# Patient Record
Sex: Male | Born: 1955 | Race: White | Hispanic: Refuse to answer | Marital: Single | Smoking: Never smoker
Health system: Southern US, Community
[De-identification: ages and names within clinical notes are randomized; demographics above are authoritative.]

## PROBLEM LIST (undated history)

## (undated) DIAGNOSIS — D696 Thrombocytopenia, unspecified: Secondary | ICD-10-CM

## (undated) DIAGNOSIS — C61 Malignant neoplasm of prostate: Secondary | ICD-10-CM

## (undated) DIAGNOSIS — G4730 Sleep apnea, unspecified: Secondary | ICD-10-CM

## (undated) DIAGNOSIS — M1990 Unspecified osteoarthritis, unspecified site: Secondary | ICD-10-CM

## (undated) DIAGNOSIS — N189 Chronic kidney disease, unspecified: Secondary | ICD-10-CM

## (undated) DIAGNOSIS — N200 Calculus of kidney: Secondary | ICD-10-CM

## (undated) DIAGNOSIS — Z87442 Personal history of urinary calculi: Secondary | ICD-10-CM

## (undated) DIAGNOSIS — E538 Deficiency of other specified B group vitamins: Secondary | ICD-10-CM

## (undated) DIAGNOSIS — R609 Edema, unspecified: Secondary | ICD-10-CM

## (undated) DIAGNOSIS — I10 Essential (primary) hypertension: Secondary | ICD-10-CM

## (undated) DIAGNOSIS — M109 Gout, unspecified: Secondary | ICD-10-CM

## (undated) HISTORY — PX: TONSILLECTOMY: SUR1361

## (undated) HISTORY — DX: Essential (primary) hypertension: I10

## (undated) HISTORY — PX: PROSTATE SURGERY: SHX751

## (undated) HISTORY — PX: COLONOSCOPY: SHX174

---

## 2000-03-03 ENCOUNTER — Encounter: Payer: Self-pay | Admitting: Internal Medicine

## 2000-12-19 ENCOUNTER — Encounter: Payer: Self-pay | Admitting: Internal Medicine

## 2003-05-11 ENCOUNTER — Encounter: Payer: Self-pay | Admitting: Internal Medicine

## 2003-11-10 ENCOUNTER — Ambulatory Visit: Payer: Self-pay | Admitting: Unknown Physician Specialty

## 2004-01-31 ENCOUNTER — Observation Stay: Payer: Self-pay

## 2006-01-24 HISTORY — PX: PROSTATECTOMY: SHX69

## 2006-01-26 ENCOUNTER — Encounter (INDEPENDENT_AMBULATORY_CARE_PROVIDER_SITE_OTHER): Payer: Self-pay | Admitting: Specialist

## 2006-01-26 ENCOUNTER — Inpatient Hospital Stay (HOSPITAL_COMMUNITY): Admission: RE | Admit: 2006-01-26 | Discharge: 2006-01-28 | Payer: Self-pay | Admitting: Urology

## 2006-11-02 DIAGNOSIS — I10 Essential (primary) hypertension: Secondary | ICD-10-CM | POA: Insufficient documentation

## 2006-11-22 ENCOUNTER — Ambulatory Visit: Payer: Self-pay | Admitting: Internal Medicine

## 2006-11-22 DIAGNOSIS — C61 Malignant neoplasm of prostate: Secondary | ICD-10-CM

## 2006-11-22 DIAGNOSIS — G479 Sleep disorder, unspecified: Secondary | ICD-10-CM | POA: Insufficient documentation

## 2006-11-22 DIAGNOSIS — M109 Gout, unspecified: Secondary | ICD-10-CM

## 2006-11-27 ENCOUNTER — Encounter: Payer: Self-pay | Admitting: Internal Medicine

## 2007-05-17 ENCOUNTER — Ambulatory Visit: Payer: Self-pay | Admitting: Internal Medicine

## 2007-05-21 LAB — CONVERTED CEMR LAB
Albumin: 4.1 g/dL (ref 3.5–5.2)
Basophils Absolute: 0 10*3/uL (ref 0.0–0.1)
Basophils Relative: 0.1 % (ref 0.0–1.0)
CO2: 32 meq/L (ref 19–32)
Cholesterol: 194 mg/dL (ref 0–200)
Creatinine, Ser: 1.1 mg/dL (ref 0.4–1.5)
Eosinophils Absolute: 0.2 10*3/uL (ref 0.0–0.7)
GFR calc Af Amer: 91 mL/min
GFR calc non Af Amer: 75 mL/min
LDL Cholesterol: 129 mg/dL — ABNORMAL HIGH (ref 0–99)
Lymphocytes Relative: 25.4 % (ref 12.0–46.0)
MCHC: 33.7 g/dL (ref 30.0–36.0)
Neutrophils Relative %: 63.7 % (ref 43.0–77.0)
Phosphorus: 3.4 mg/dL (ref 2.3–4.6)
Platelets: 202 10*3/uL (ref 150–400)
RBC: 5.69 M/uL (ref 4.22–5.81)
Sodium: 142 meq/L (ref 135–145)
VLDL: 36 mg/dL (ref 0–40)

## 2008-01-21 ENCOUNTER — Ambulatory Visit: Payer: Self-pay | Admitting: Internal Medicine

## 2008-03-06 ENCOUNTER — Encounter (INDEPENDENT_AMBULATORY_CARE_PROVIDER_SITE_OTHER): Payer: Self-pay | Admitting: *Deleted

## 2008-09-08 ENCOUNTER — Encounter: Payer: Self-pay | Admitting: Internal Medicine

## 2008-11-24 ENCOUNTER — Ambulatory Visit: Payer: Self-pay | Admitting: Internal Medicine

## 2008-11-26 LAB — CONVERTED CEMR LAB
ALT: 30 units/L (ref 0–53)
AST: 26 units/L (ref 0–37)
Alkaline Phosphatase: 78 units/L (ref 39–117)
Bilirubin, Direct: 0 mg/dL (ref 0.0–0.3)
Calcium: 8.7 mg/dL (ref 8.4–10.5)
Creatinine, Ser: 1.2 mg/dL (ref 0.4–1.5)
Eosinophils Absolute: 0.3 10*3/uL (ref 0.0–0.7)
Eosinophils Relative: 2.9 % (ref 0.0–5.0)
Glucose, Bld: 90 mg/dL (ref 70–99)
HDL: 35.7 mg/dL — ABNORMAL LOW (ref >39.00)
LDL Cholesterol: 129 mg/dL — ABNORMAL HIGH (ref 0–99)
Lymphocytes Relative: 21.3 % (ref 12.0–46.0)
MCV: 90.3 fL (ref 78.0–100.0)
Neutrophils Relative %: 66 % (ref 43.0–77.0)
Phosphorus: 3.1 mg/dL (ref 2.3–4.6)
Platelets: 188 10*3/uL (ref 150.0–400.0)
Potassium: 4.2 meq/L (ref 3.5–5.1)
Sodium: 141 meq/L (ref 135–145)
Total CHOL/HDL Ratio: 5
Total Protein: 7.2 g/dL (ref 6.0–8.3)
VLDL: 24.8 mg/dL (ref 0.0–40.0)
WBC: 9.8 10*3/uL (ref 4.5–10.5)

## 2009-03-09 ENCOUNTER — Ambulatory Visit: Payer: Self-pay | Admitting: Internal Medicine

## 2009-03-10 LAB — CONVERTED CEMR LAB
Albumin: 4 g/dL (ref 3.5–5.2)
BUN: 14 mg/dL (ref 6–23)
CO2: 31 meq/L (ref 19–32)
Calcium: 9.1 mg/dL (ref 8.4–10.5)
Chloride: 103 meq/L (ref 96–112)
Creatinine, Ser: 1 mg/dL (ref 0.4–1.5)

## 2009-03-16 ENCOUNTER — Encounter: Payer: Self-pay | Admitting: Internal Medicine

## 2009-10-30 ENCOUNTER — Ambulatory Visit: Payer: Self-pay | Admitting: Internal Medicine

## 2010-02-23 NOTE — Assessment & Plan Note (Signed)
Summary: 2 MTH FU/CLE   Vital Signs:  Patient profile:   55 year old male Weight:      296 pounds Temp:     98.5 degrees F oral Pulse rate:   72 / minute Pulse rhythm:   regular BP sitting:   140 / 80  (left arm) Cuff size:   large  Vitals Entered By: Mervin Hack CMA Duncan Dull) (March 09, 2009 10:33 AM)  Serial Vital Signs/Assessments:  Time      Position  BP       Pulse  Resp  Temp     By           R Arm     144/98                         Cindee Salt MD  CC: follow-up visit   History of Present Illness: No problems with the losartan except a vague sense of being "rundown" some tired feelings intermittently Nodding off at times (like in movie tomorrow) Did give up his AM coffee  weight up 9# He has been trying to be more careful with diet Not going on treadmill very often No drinks with calories for the most part  No ankle edema for the most part No chest pain  No SOB  Allergies: 1)  Ziac (Bisoprolol-Hydrochlorothiazide) 2)  Zestoretic (Lisinopril-Hydrochlorothiazide) 3)  Avapro (Irbesartan) 4)  Amlodipine Besylate (Amlodipine Besylate)  Past History:  Past medical, surgical, family and social histories (including risk factors) reviewed for relevance to current acute and chronic problems.  Past Medical History: Reviewed history from 11/24/2008 and no changes required. Hypertension Gout Prostate cancer-------------------------Dr Reuel Boom  Past Surgical History: Reviewed history from 11/22/2006 and no changes required. Syncope- Echo/ Cardiolite negative 02/02 Radical prostatectomy 1/08  Family History: Reviewed history from 11/22/2006 and no changes required. Dad with colon cancer, HTN Mom died @58 --breast cancer 1 brother No CAD, DM No other prostate cancer  Social History: Reviewed history from 01/21/2008 and no changes required.  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   Neck:  supple, no masses, no thyromegaly, no carotid bruits, and no cervical lymphadenopathy.   Lungs:  normal respiratory effort and normal breath sounds.   Heart:  normal rate, regular rhythm, no murmur, and no gallop.   Extremities:  no sig edema Psych:  normally interactive, good eye contact, not anxious appearing, and not depressed appearing.     Impression & Recommendations:  Problem # 1:  HYPERTENSION (ICD-401.9) Assessment Improved  improved but still not optimal discussed lifestyle given his weight gain no sig issues with the med  will check renal profile  His updated medication list for this problem includes:    Atenolol 100 Mg Tabs (Atenolol) .Marland Kitchen... Take 1 tablet by mouth once a day    Losartan Potassium 50 Mg Tabs (Losartan potassium) .Marland Kitchen... 1 by mouth daily for high blood pressure  BP today: 140/80 Prior BP: 140/100 (11/24/2008)  Labs Reviewed: K+: 4.2 (11/24/2008) Creat: : 1.2 (11/24/2008)   Chol: 189 (11/24/2008)   HDL: 35.70 (11/24/2008)   LDL: 129 (11/24/2008)   TG: 124.0 (11/24/2008)  Orders: TLB-Renal Function Panel (80069-RENAL) Venipuncture (16109)  Complete Medication List: 1)  Atenolol 100 Mg Tabs (Atenolol) .... Take 1 tablet by mouth once a day 2)  Coenzyme Q10 100 Mg Caps (Coenzyme q10) .... Take 2 by mouth once daily 3)  Fish Oil 500 Mg Caps (Omega-3 fatty acids) .... Take 2 by mouth once daily 4)  Vitamin D3 2000 Unit Caps (Cholecalciferol) .... Take 2 by mouth once daily 5)  Vitamin C 500 Mg Tabs (Ascorbic acid) .... Take 2 by mouth once daily 6)  Losartan Potassium 50 Mg Tabs (Losartan potassium) .Marland Kitchen.. 1 by mouth daily for high blood pressure  Patient Instructions: 1)  Please schedule a follow-up appointment in 6 months .   Current Allergies (reviewed  today): ZIAC (BISOPROLOL-HYDROCHLOROTHIAZIDE) ZESTORETIC (LISINOPRIL-HYDROCHLOROTHIAZIDE) AVAPRO (IRBESARTAN) AMLODIPINE BESYLATE (AMLODIPINE BESYLATE)

## 2010-02-23 NOTE — Assessment & Plan Note (Signed)
Summary: FOLLOW UP / LFW   Vital Signs:  Patient profile:   55 year old male Weight:      284 pounds BMI:     40.90 O2 Sat:      94 % on Room air Temp:     98.8 degrees F oral Pulse rate:   52 / minute Pulse rhythm:   regular BP sitting:   140 / 80  (left arm) Cuff size:   large  Vitals Entered By: Mervin Hack CMA Duncan Dull) (October 30, 2009 10:57 AM)  O2 Flow:  Room air CC: 6 month follow-up   History of Present Illness: Doing okay has lost  some weight tryking to cut back on portions and have more fruit walking regularly and lifting some weights  BP has been fine Wishes he didn't need them Trouble remembering if he takes them some times--discussed a pill holder  No chest pain No SOB SLight ankle edema at times  Allergies: 1)  Ziac (Bisoprolol-Hydrochlorothiazide) 2)  Zestoretic (Lisinopril-Hydrochlorothiazide) 3)  Avapro (Irbesartan) 4)  Amlodipine Besylate (Amlodipine Besylate)  Past History:  Past medical, surgical, family and social histories (including risk factors) reviewed for relevance to current acute and chronic problems.  Past Medical History: Reviewed history from 11/24/2008 and no changes required. Hypertension Gout Prostate cancer-------------------------Dr Reuel Boom  Past Surgical History: Reviewed history from 11/22/2006 and no changes required. Syncope- Echo/ Cardiolite negative 02/02 Radical prostatectomy 1/08  Family History: Reviewed history from 11/22/2006 and no changes required. Dad with colon cancer, HTN Mom died @58 --breast cancer 1 brother No CAD, DM No other prostate cancer  Social History: Reviewed history from 01/21/2008 and no changes required.  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   Neck:   supple, no masses, no thyromegaly, no carotid bruits, and no cervical lymphadenopathy.   Lungs:  normal respiratory effort, no intercostal retractions, no accessory muscle use, and normal breath sounds.   Heart:  normal rate, regular rhythm, no murmur, and no gallop.   Extremities:  no edema Psych:  normally interactive, good eye contact, not anxious appearing, and not depressed appearing.     Impression & Recommendations:  Problem # 1:  HYPERTENSION (ICD-401.9) Assessment Unchanged doing well no changes needed hopes to get off meds as he works on fitness  His updated medication list for this problem includes:    Losartan Potassium 50 Mg Tabs (Losartan potassium) .Marland Kitchen... 1 by mouth daily for high blood pressure    Atenolol 100 Mg Tabs (Atenolol) .Marland Kitchen... Take 1 tablet by mouth once a day  BP today: 140/80 Prior BP: 140/80 (03/09/2009)  Labs Reviewed: K+: 4.3 (03/09/2009) Creat: : 1.0 (03/09/2009)   Chol: 189 (11/24/2008)   HDL: 35.70 (11/24/2008)   LDL: 129 (11/24/2008)   TG: 124.0 (11/24/2008)  Complete Medication List: 1)  Losartan Potassium 50 Mg Tabs (Losartan potassium) .Marland Kitchen.. 1 by mouth daily for high blood pressure 2)  Atenolol 100 Mg Tabs (Atenolol) .... Take 1 tablet by mouth once a day 3)  Coenzyme Q10 100 Mg Caps (Coenzyme q10) .... Take 2 by mouth once daily 4)  Fish Oil 500 Mg Caps (Omega-3 fatty acids) .... Take 2 by mouth once daily 5)  Vitamin D3 2000 Unit Caps (Cholecalciferol) .... Take 2 by mouth once daily  6)  Vitamin C 500 Mg Tabs (Ascorbic acid) .... Take 2 by mouth once daily 7)  L-arginine 1000 Mg Tabs (Arginine) .... Take 1 by mouth once daily  Patient Instructions: 1)  Please schedule a follow-up appointment in 6 months for physical Prescriptions: ATENOLOL 100 MG  TABS (ATENOLOL) Take 1 tablet by mouth once a day  #90 Tablet x 3   Entered by:   Mervin Hack CMA (AAMA)   Authorized by:   Cindee Salt MD   Signed by:   Mervin Hack CMA (AAMA) on  10/30/2009   Method used:   Electronically to        [facility] [address]* (retail)       [address] [place], [place]  [address]       Ph: [phone]       Fax: [phone]   RxID:   [id number] LOSARTAN POTASSIUM 50 MG TABS (LOSARTAN POTASSIUM) 1 by mouth daily for high blood pressure  #30 x 12   Entered by:   Mervin Hack CMA (AAMA)   Authorized by:   Cindee Salt MD   Signed by:   Mervin Hack CMA (AAMA) on 10/30/2009   Method used:   Electronically to        [facility] [address] (retail)       [address] [place], [place]  [address]       Ph: [phone]       Fax: [phone]   RxID:   [id number]   Current Allergies (reviewed today): ZIAC (BISOPROLOL-HYDROCHLOROTHIAZIDE) ZESTORETIC (LISINOPRIL-HYDROCHLOROTHIAZIDE) AVAPRO (IRBESARTAN) AMLODIPINE BESYLATE (AMLODIPINE BESYLATE)  Appended Document: FOLLOW UP / LFW    Clinical Lists Changes  Orders: Added new Service order of Admin 1st Vaccine (70623) - Signed Added new Service order of Flu Vaccine 43yrs + [phone]) - Signed Observations: Added new observation of FLU VAX VIS: 08/18/09 version (10/30/2009 11:45) Added new observation of FLU VAXLOT: [id number] (10/30/2009 11:45) Added new observation of FLU VAXMFR: [employer]axosmith[employer]line (10/30/2009 11:45) Added new observation of FLU VAX EXP: 07/24/2010 (10/30/2009 11:45) Added new observation of FLU VAX DSE: 0.73ml (10/30/2009 11:45) Added new observation of FLU VAX: Fluvax 3+ (10/30/2009 11:45)    Flu Vaccine Consent Questions     Do you have a history of severe allergic reactions to this vaccine? no    Any prior history of allergic reactions to egg and/or gelatin? no    Do you have a sensitivity to the preservative Thimersol? no    Do you have a past history of Guillan-Barre Syndrome? no    Do you currently have an acute febrile illness? no    Have you ever had a  severe reaction to latex? no    Vaccine information given and explained to patient? yes    Are you currently pregnant? no    Lot Number:[id number]   Exp Date:07/24/2010   Site Given  Left Deltoid IM    .lbflu

## 2010-02-23 NOTE — Letter (Signed)
Summary: [facility]  [facility]   Imported By: Lanelle Bal 03/18/2009 12:45:41  _____________________________________________________________________  External Attachment:    Type:   Image     Comment:   External Document  Appended Document: [facility] prostate cancer follow up checking PSA but requests that I do it yearly and send him back it if goes up any

## 2010-03-10 ENCOUNTER — Encounter: Payer: Self-pay | Admitting: Internal Medicine

## 2010-05-03 ENCOUNTER — Encounter: Payer: Self-pay | Admitting: Internal Medicine

## 2010-06-11 NOTE — Op Note (Signed)
NAME:  Francisco Freeman, Francisco Freeman NO.:  [id number]   MEDICAL RECORD NO.:  [id number]          PATIENT TYPE:  INP   LOCATION:  0002                         FACILITY:  [facility]   PHYSICIAN:  Heloise Purpura, MD      DATE OF BIRTH:  27-Apr-1955   DATE OF PROCEDURE:  01/26/2006  DATE OF DISCHARGE:                               OPERATIVE REPORT   PREOPERATIVE DIAGNOSIS:  Clinically localized adenocarcinoma of  prostate.   POSTOPERATIVE DIAGNOSIS:  Clinically localized adenocarcinoma of  prostate.   PROCEDURES.:  1. Robotic assisted laparoscopic radical prostatectomy (left nerve      sparing).  2. Bilateral laparoscopic pelvic lymphadenectomy.   SURGEON:  Heloise Purpura, MD   ASSISTANT:  Excell Seltzer. Annabell Howells, M.D.   ANESTHESIA:  General.   COMPLICATIONS:  None.   ESTIMATED BLOOD LOSS:  500 mL.   INTRAVENOUS FLUIDS:  1700 mL of lactated Ringer's.   SPECIMENS:  1. Prostate and seminal vesicles.  2. Right pelvic lymph nodes.  3. Left pelvic lymph nodes.   DRAINS:  1. #20-French Coude catheter.  2. #19 Blake pelvic drain.   INDICATIONS:  Mr. Schoneman is a 55 year old gentleman with clinical  stage T1C prostate cancer with a PSA of 5.8 and Gleason score 4 + 3 = 7.  Pretreatment IIEF score was 19 with an IPSS score of 1.  He underwent a  bone scan and CT scan which were negative for metastatic disease.  After  discussing management options for clinically localized prostate cancer,  the patient elected to proceed with the above procedures.  Potential  risks and benefits were discussed with the patient and he consented.   DESCRIPTION OF PROCEDURE:  The patient was taken to the operating room  and a general anesthetic was administered.  He was given preoperative  antibiotics, placed in the dorsal lithotomy position, prepped and draped  in the usual sterile fashion.  Next a preoperative time-out was  performed.  A Foley catheter was then inserted into the bladder.  A site  was  selected 18 cm from the pubic symphysis and just to the left of the  umbilicus for placement of the camera port.  This was placed using a  standard open Hassan technique.  This allowed entry into the peritoneal  cavity under direct vision and without difficulty.  Due to the patient's  obese abdomen, a long 12 mm port was placed.  A pneumoperitoneum was  established and a 0 degrees lens was used to inspect the abdomen.  There  was no evidence of any intra-abdominal injuries or other abnormalities  except an excessive amount of intra-abdominal fat.  Attention was then  turned to placement of the remaining ports.  Bilateral 8 mm robotic  ports were placed 16 cm from the pubic symphysis and 10 cm lateral to  the camera port.  An additional 8 mm port was placed in the far left  lateral abdominal wall.  A 5 mm port was placed between the camera port  and the right robotic port.  An additional 12 mm port was placed in the  far right lateral abdominal  wall for laparoscopic assistance.  All ports  placed under direct vision without difficulty.  Surgical cart was then  docked.  With the aid of the cautery scissors, the bladder was reflected  posteriorly allowing entry into the space of Retzius and identification  of the endopelvic fascia and prostate.  The endopelvic fascia was then  incised from the apex back to the base of prostate bilaterally and the  underlying levator muscle fibers were swept laterally off the prostate.  This isolated the dorsal venous complex which was then stapled and  divided with a 45 mm flex ETS stapler.  Attention then turned to the  bladder neck which was identified with the aid of Foley catheter  manipulation.  The anterior bladder neck was incised exposing the Foley  catheter.  The catheter balloon was deflated and the catheter was  brought into the operative field and used to retract the prostate  anteriorly.  The posterior bladder neck was then divided and  dissection  continued between the bladder neck and prostate until seminal vesicles  and vasa differentia were identified.  The vasa differentia were  isolated and divided.  Seminal vesicles were also dissected free with  care to control the seminal vesicle arterial blood supply.  The seminal  vesicles were then also lifted anteriorly.  The space between  Denonvilliers fascia and the anterior rectum was then bluntly developed  thereby isolating the vascular pedicles of the prostate.  The lateral  prostatic fascia on the left side of the prostate was incised allowing  the neurovascular bundle to be swept laterally and posteriorly off the  prostate.  The prostate pedicle was then ligated with Hem-o-lok clips  and sharply divided with cold scissor dissection above this level.  The  vascular pedicle on the right side was then ligated in a wide non nerve  sparing fashion with Hem-o-lok clips and divided.  The neurovascular  bundle was swept off the apex of the prostate on the left side.  The  urethra was then sharply divided allowing the prostate specimen be  disarticulated and placed up into the abdomen for later removal.  The  pelvis was then copiously irrigated.  With irrigation in the pelvis, air  was injected into the rectal catheter and there was no evidence of a  rectal injury.  Hemostasis was ensured.  Attention then turned to the  right pelvic sidewall.  The fibrofatty tissue between the external iliac  vein, confluence of iliac vessels, obturator nerve, and Cooper's  ligament was dissected free from the pelvic sidewall with Hem-o-lok  clips used for hemostasis and lymphostasis.  This specimen was then  passed off for permanent pathologic analysis.  An identical procedure  was then performed on the contralateral side.  Attention then returned  to the pelvis.  A 2-0 Vicryl stitch was placed at the 6 o'clock position using a slip knot to reapproximate the bladder neck and urethra.  A   double-armed 3-0 Monocryl suture was then used to perform a 360 degrees  running tension-free anastomosis between the bladder neck and urethra.  A new 20-French Coude catheter was inserted into the bladder.  This  catheter was irrigated.  There were no evidence of blood clots in the  bladder and the anastomosis appeared to be watertight.  A #19 Blake  pelvic drain was then brought to the left robotic port and appropriately  positioned in the pelvis.  It was secured to skin with a nylon suture.  The Endopouch retrieval  bag was then used to retrieve the prostate  specimen through the periumbilical port.  A 0 Vicryl suture was used to  close the right lateral 12 mm port site with the aid of the suture  passer device.  The prostate specimen was then removed intact within the  Endopouch retrieval bag.  This fascial opening was closed with  interrupted figure-of-eight 0 Vicryl sutures.  All remaining ports had  been removed under direct vision.  All port sites were then injected  with 0.25% Marcaine and reapproximated at the skin level with staples.  The patient appeared to tolerate the procedure well and without  complications.  He was able to be awakened and transferred to recovery  in satisfactory condition.          ______________________________  Heloise Purpura, MD  Electronically Signed    LB/MEDQ  D:  01/26/2006  T:  01/26/2006  Job:  [id number]

## 2010-06-11 NOTE — Discharge Summary (Signed)
NAME:  Francisco Freeman, VATH NO.:  [id number]   MEDICAL RECORD NO.:  [id number]          PATIENT TYPE:  INP   LOCATION:  1403                         FACILITY:  [facility]   PHYSICIAN:  Heloise Purpura, MD      DATE OF BIRTH:  Feb 12, 1955   DATE OF ADMISSION:  01/26/2006  DATE OF DISCHARGE:  01/28/2006                               DISCHARGE SUMMARY   ADMISSION DIAGNOSIS:  Prostate cancer.   DISCHARGE DIAGNOSIS:  Prostate cancer.   PROCEDURE:  1. Robotic assisted laparoscopic radical prostatectomy.  2. Bilateral pelvic lymphadenectomy.   HISTORY:  For full details please see the admission history and  physical. Briefly, [name] is a 55 year old gentleman with clinical  stage T1C prostate cancer with a PSA of 5.8 and Gleason score 4+3=7.  He  underwent a CT scan and bone scan which were negative for metastatic  disease. After discussing management options for clinically localized  prostate cancer, the patient elected to proceed with surgical therapy  with a robotic prostatectomy.   HOSPITAL COURSE:  On January 26, 2006 the patient was taken to the  operating room and underwent a robotic assisted laparoscopic radical  prostatectomy and bilateral pelvic lymphadenectomy. He tolerated this  procedure well without complication. Postoperatively, he was able to be  transferred to a regular room after recovering from anesthesia. On the  evening of surgery, he was able to begin ambulating which he did without  difficulty. On postoperative day one, he was gradually advanced to a  clear liquid diet. He was then transitioned to oral pain medication. On  the afternoon of postoperative day one, he was ambulating without  tremendous difficulty. However, he still was having fairly significant  nausea. Due to the fact that he was having persistent nausea and the  fact that he lives alone, it was decided to observe him over night.  On  postoperative day two, he was doing well and was  able to be discharged  home. His renal function and hemoglobin levels were monitored throughout  his hospital course and were found to be stable. In addition, he had  minimal output from his pelvic drain which was able to be removed on  postoperative day one.   DISPOSITION:  Home.   DISCHARGE MEDICATIONS:  1. Patient was instructed to resume his regular home medications      except any aspirin or nonsteroidal antiinflammatory drugs or herbal      supplements for  1 week.  1. He was given a prescription to take Vicodin p.r.n. for pain.  2. He was told to use Colace as a stool softener.  3. He was also given a prescription to begin Cipro one day prior to      his return visit for Foley catheter removal.   DISCHARGE INSTRUCTIONS:  The patient was instructed to be ambulatory but  specifically told to refrain from any heavy lifting, strenuous activity,  or driving.  He was instructed on routine Foley catheter care and given a leg bag for  daytime usage.   FOLLOW UP:  Mr. Renteria will follow-up in one week for removal  of his  Foley catheter as well as to review his surgical pathology in detail.           ______________________________  Heloise Purpura, MD  Electronically Signed     LB/MEDQ  D:  01/28/2006  T:  01/28/2006  Job:  [id number]

## 2010-06-11 NOTE — H&P (Signed)
NAME:  Francisco Freeman, ALBEE NO.:  [id number]   MEDICAL RECORD NO.:  [id number]          PATIENT TYPE:  INP   LOCATION:  NA                           [facility]ACILITY[facility]  [facility]   PHYSICIAN:  Heloise Purpura, MD      DATE OF BIRTH:  04/28/1955   DATE OF ADMISSION:  DATE OF DISCHARGE:                              HISTORY & PHYSICAL   CHIEF COMPLAINT:  Prostate cancer.   HISTORY:  [name] is a 55 year old gentleman with clinical stage  T1c prostate cancer with a PSA of 5.8 and Gleason score 4 +3 = 7.  Two  out of 8 cores were positive on his prostate biopsy on the right side of  the prostate.  He underwent a bone scan and CT scan which were negative  for metastatic disease.  After discussing management options for  clinically localized prostate cancer, the patient elected to proceed  with surgical therapy with a robotic prostatectomy.   PAST MEDICAL HISTORY:  Hypertension.   PAST SURGICAL HISTORY:  None.   MEDICATIONS:  1. Triamterene.  2. Atenolol.   ALLERGIES:  No known drug allergies.   FAMILY HISTORY:  There is a history of colon cancer in the patient's  father.  His mother died of breast cancer.  There is no history of GU  malignancy or prostate cancer, specifically.   SOCIAL HISTORY:  The patient works as a Manufacturing engineer.  He works mostly  on the computer from home.  He is single.  He denies tobacco use.  He  drinks approximately 3 glasses of alcohol per day.   REVIEW OF SYSTEMS:  A complete review of systems was performed, and all  systems are negative.   PHYSICAL EXAMINATION:  CONSTITUTIONAL:  Well-nourished, well-developed,  age-appropriate male in no acute distress.  CARDIOVASCULAR:  Regular rate and rhythm without obvious murmurs.  LUNGS:  Clear bilaterally.  ABDOMEN:  Obese, soft, nontender, nondistended.  DRE:  Prostate is about 30 g without nodularity or induration.   IMPRESSION:  Clinically localized adenocarcinoma of the prostate.   PLAN:  Mr.  Francisco Freeman will undergo a robotic-assisted laparoscopic radical  prostatectomy and bilateral pelvic lymphadenectomy.  He will then be  admitted to the hospital for routine postoperative care.           ______________________________  Heloise Purpura, MD  Electronically Signed     LB/MEDQ  D:  01/26/2006  T:  01/26/2006  Job:  [id number]

## 2010-06-14 ENCOUNTER — Encounter: Payer: Self-pay | Admitting: Internal Medicine

## 2010-06-14 ENCOUNTER — Ambulatory Visit (INDEPENDENT_AMBULATORY_CARE_PROVIDER_SITE_OTHER): Payer: BC Managed Care – PPO | Admitting: Internal Medicine

## 2010-06-14 VITALS — BP 140/100 | HR 56 | Temp 98.1°F | Ht 70.0 in | Wt 296.5 lb

## 2010-06-14 DIAGNOSIS — Z0000 Encounter for general adult medical examination without abnormal findings: Secondary | ICD-10-CM | POA: Insufficient documentation

## 2010-06-14 DIAGNOSIS — G479 Sleep disorder, unspecified: Secondary | ICD-10-CM

## 2010-06-14 DIAGNOSIS — C61 Malignant neoplasm of prostate: Secondary | ICD-10-CM

## 2010-06-14 DIAGNOSIS — M109 Gout, unspecified: Secondary | ICD-10-CM

## 2010-06-14 DIAGNOSIS — I10 Essential (primary) hypertension: Secondary | ICD-10-CM

## 2010-06-14 LAB — HEPATIC FUNCTION PANEL
ALT: 29 U/L (ref 0–53)
Bilirubin, Direct: 0.2 mg/dL (ref 0.0–0.3)
Total Bilirubin: 0.7 mg/dL (ref 0.3–1.2)

## 2010-06-14 LAB — BASIC METABOLIC PANEL
BUN: 11 mg/dL (ref 6–23)
CO2: 27 mEq/L (ref 19–32)
Chloride: 103 mEq/L (ref 96–112)
Creatinine, Ser: 1.1 mg/dL (ref 0.4–1.5)
Glucose, Bld: 71 mg/dL (ref 70–99)
Potassium: 4.2 mEq/L (ref 3.5–5.1)

## 2010-06-14 LAB — CBC WITH DIFFERENTIAL/PLATELET
Basophils Absolute: 0.1 10*3/uL (ref 0.0–0.1)
Basophils Relative: 0.8 % (ref 0.0–3.0)
Eosinophils Relative: 3.5 % (ref 0.0–5.0)
Lymphocytes Relative: 24.3 % (ref 12.0–46.0)
MCV: 87.9 fl (ref 78.0–100.0)
Monocytes Absolute: 0.7 10*3/uL (ref 0.1–1.0)
Monocytes Relative: 7.9 % (ref 3.0–12.0)
Neutrophils Relative %: 63.5 % (ref 43.0–77.0)
RBC: 5.67 Mil/uL (ref 4.22–5.81)
WBC: 8.4 10*3/uL (ref 4.5–10.5)

## 2010-06-14 LAB — URIC ACID: Uric Acid, Serum: 6.5 mg/dL (ref 4.0–7.8)

## 2010-06-14 MED ORDER — ATENOLOL 100 MG PO TABS: 100.0000 mg | ORAL_TABLET | Freq: Every day | ORAL | Status: DC

## 2010-06-14 NOTE — Progress Notes (Signed)
Subjective:    Patient ID: Francisco Freeman, male    DOB: 1956-01-06, 55 y.o.   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  , Disp: , Rfl: ;  atenolol (TENORMIN) 100 MG tablet, Take 100 mg by mouth daily.  , Disp: , Rfl: ;  Cholecalciferol (VITAMIN D3) 2000 UNITS TABS, Take 1 tablet by mouth 2 (two) times daily.  , Disp: , Rfl: ;  losartan (COZAAR) 50 MG tablet, Take 50 mg by mouth daily.  , Disp: , Rfl:  DISCONTD: co-enzyme Q-10 50 MG capsule, Take 50 mg by mouth 2 (two) times daily.  , Disp: , Rfl: ;  DISCONTD: L-Arginine 1000 MG TABS, Take 1 tablet by mouth daily.  , Disp: , Rfl: ;  DISCONTD: Omega-3 Fatty Acids (FISH OIL) 500 MG CAPS, Take 2 capsules by mouth daily.  , Disp: , Rfl:   Past Medical History  Diagnosis Date  . Hypertension   . Gout   . Cancer     prostate    Past Surgical History  Procedure Date  . Prostatectomy 01/24/2006    Family History  Problem Relation Age of Onset  . Cancer Mother 29    breast  . Cancer Father     colon  . Hypertension Father     History   Social History  . Marital Status: Single    Spouse Name: N/A    Number of Children: 0  . Years of Education: N/A   Occupational History  . Drafter     [employer]   Social History Main Topics  . Smoking status: Never Smoker   . Smokeless tobacco: Never Used  . Alcohol Use: 0.0 oz/week    4-6 Cans of beer per week  . Drug Use: No  . Sexually Active: Not on file   Other Topics Concern  . Not on file   Social History Narrative  . No narrative on file   Review of Systems  Constitutional: Negative  for fever and fatigue.       Wears seat belt  HENT: Positive for dental problem. Negative for hearing loss, congestion, rhinorrhea and tinnitus.        Needs cavities filled---delayed due to money  Eyes: Negative for visual disturbance.       No diplopia or focal vision loss  Respiratory: Negative for cough, chest tightness and shortness of breath.   Cardiovascular: Positive for leg swelling. Negative for chest pain and palpitations.       Occ leg edema---at the end of day. Better in AM  Gastrointestinal: Negative for nausea, vomiting, constipation and blood in stool.       No heartburn  Genitourinary: Negative for dysuria, urgency, decreased urine volume and difficulty urinating.       Mild stress incontinence since surgery---uses pad when out Some ED since the surgery  Musculoskeletal: Negative for joint swelling, arthralgias and gait problem.  Skin: Negative for rash.       Has hard nodule on lateral right thigh---stable  Neurological: Negative for dizziness, syncope, weakness, light-headedness, numbness and headaches.  Hematological: Negative for adenopathy. Does not bruise/bleed easily.  Psychiatric/Behavioral: Negative for sleep disturbance and dysphoric mood. The patient is not nervous/anxious.        Work stress but no sig mood issues Very limited beer now       Objective:   Physical Exam  Constitutional: He appears well-developed and well-nourished. No distress.  HENT:  Head: Normocephalic and atraumatic.  Right Ear: External ear normal.  Left Ear: External ear normal.  Mouth/Throat: Oropharynx is clear and moist. No oropharyngeal exudate.       TMs normal  Eyes: Conjunctivae and EOM are normal. Pupils are equal, round, and reactive to light.       Fundi benign  Neck: Normal range of motion. Neck supple. No thyromegaly present.  Cardiovascular: Normal rate, regular rhythm, normal heart sounds and intact distal pulses.  Exam reveals no gallop.   No murmur  heard. Pulmonary/Chest: Effort normal and breath sounds normal. No respiratory distress. He has no wheezes. He has no rales.  Abdominal: Soft. There is no tenderness.  Musculoskeletal: Normal range of motion. He exhibits no tenderness.       Trace edema  Lymphadenopathy:    He has no cervical adenopathy.  Skin: Skin is warm. No rash noted.       4-34mm nodule on right thigh---probably dermatofibroma  Psychiatric: He has a normal mood and affect. His behavior is normal. Judgment and thought content normal.          Assessment & Plan:

## 2010-11-02 ENCOUNTER — Other Ambulatory Visit: Payer: Self-pay | Admitting: *Deleted

## 2010-11-02 MED ORDER — LOSARTAN POTASSIUM 50 MG PO TABS: 50.0000 mg | ORAL_TABLET | Freq: Every day | ORAL | Status: DC

## 2010-12-02 ENCOUNTER — Other Ambulatory Visit: Payer: Self-pay | Admitting: *Deleted

## 2010-12-02 MED ORDER — LOSARTAN POTASSIUM 50 MG PO TABS: 50.0000 mg | ORAL_TABLET | Freq: Every day | ORAL | Status: DC

## 2010-12-14 ENCOUNTER — Ambulatory Visit: Payer: BC Managed Care – PPO | Admitting: Internal Medicine

## 2010-12-27 ENCOUNTER — Encounter: Payer: Self-pay | Admitting: Internal Medicine

## 2010-12-27 ENCOUNTER — Ambulatory Visit (INDEPENDENT_AMBULATORY_CARE_PROVIDER_SITE_OTHER): Payer: BC Managed Care – PPO | Admitting: Internal Medicine

## 2010-12-27 DIAGNOSIS — M109 Gout, unspecified: Secondary | ICD-10-CM

## 2010-12-27 DIAGNOSIS — G479 Sleep disorder, unspecified: Secondary | ICD-10-CM

## 2010-12-27 DIAGNOSIS — I10 Essential (primary) hypertension: Secondary | ICD-10-CM

## 2010-12-27 MED ORDER — SPIRONOLACTONE 50 MG PO TABS: 50.0000 mg | ORAL_TABLET | Freq: Every day | ORAL | Status: DC

## 2010-12-27 MED ORDER — LOSARTAN POTASSIUM 100 MG PO TABS: 100.0000 mg | ORAL_TABLET | Freq: Every day | ORAL | Status: DC

## 2010-12-27 NOTE — Assessment & Plan Note (Signed)
BP Readings from Last 3 Encounters:  12/27/10 192/116  06/14/10 140/100  10/30/09 140/80   Really out of control Will increase losartan and add spironolactone

## 2010-12-27 NOTE — Assessment & Plan Note (Signed)
Has been quiet Can't go on diazide due to this

## 2010-12-27 NOTE — Assessment & Plan Note (Signed)
Sleeping better now No meds for  this

## 2010-12-27 NOTE — Progress Notes (Signed)
  Subjective:    Patient ID: Francisco Freeman, male    DOB: 03-Oct-1955, 55 y.o.   MRN: [id number]  HPI Francisco Freeman is up again Overload--- ready to "not worry about this anymore" Did try weight loss program for 2 months ([facility]) Gained after he stopped  Working 80 hours a week now--2 jobs. Works out of home for both jobs No longer in school Not much time for exercise  Hasn't been checking BP No headaches No chest pain No SOB occ transient edema  No gout attacks of late No arthritis pains  Current Outpatient Prescriptions on File Prior to Visit  Medication Sig Dispense Refill  . Ascorbic Acid (VITAMIN C) 500 MG tablet Take 500 mg by mouth 2 (two) times daily.        Marland Kitchen atenolol (TENORMIN) 100 MG tablet Take 1 tablet (100 mg total) by mouth daily.  90 tablet  3  . Cholecalciferol (VITAMIN D3) 2000 UNITS TABS Take 1 tablet by mouth 2 (two) times daily.        Marland Kitchen losartan (COZAAR) 50 MG tablet Take 1 tablet (50 mg total) by mouth daily.  30 tablet  1    Allergies  Allergen Reactions  . Amlodipine Besylate     REACTION: felt bad  . Bisoprolol-Hydrochlorothiazide     REACTION: Syncope  . Irbesartan     REACTION: BP elevated  . Lisinopril-Hydrochlorothiazide     REACTION: Cough    Past Medical History  Diagnosis Date  . Hypertension   . Gout   . Cancer     prostate    Past Surgical History  Procedure Date  . Prostatectomy 01/24/2006    Family History  Problem Relation Age of Onset  . Cancer Mother 7    breast  . Cancer Father     colon  . Hypertension Father     History   Social History  . Marital Status: Single    Spouse Name: N/A    Number of Children: 0  . Years of Education: N/A   Occupational History  . Drafter     [employer]   Social History Main Topics  . Smoking status: Never Smoker   . Smokeless tobacco: Never Used  . Alcohol Use: 0.0 Freeman/week    4-6 Cans of beer per week  . Drug Use: No  . Sexually Active: Not  on file   Other Topics Concern  . Not on file   Social History Narrative  . No narrative on file   Review of Systems Appetite is fine Weight is up a few pounds Sleeps well---still has nocturia x 1     Objective:   Physical Exam  Constitutional: He appears well-developed and well-nourished. No distress.  Neck: Normal range of motion. Neck supple.  Cardiovascular: Normal rate and regular rhythm.  Exam reveals no gallop.   No murmur heard. Pulmonary/Chest: Effort normal and breath sounds normal. No respiratory distress. He has no wheezes. He has no rales.  Musculoskeletal:       Thick calves without pitting  Lymphadenopathy:    He has no cervical adenopathy.  Psychiatric: He has a normal mood and affect. His behavior is normal. Judgment and thought content normal.          Assessment & Plan:

## 2010-12-27 NOTE — Patient Instructions (Signed)
Please double the losartan to 100mg  daily---use 2 of the 50mg  till gone, then the new Rx Add the spironolactone daily

## 2011-01-10 ENCOUNTER — Encounter: Payer: Self-pay | Admitting: Internal Medicine

## 2011-01-10 ENCOUNTER — Ambulatory Visit (INDEPENDENT_AMBULATORY_CARE_PROVIDER_SITE_OTHER): Payer: BC Managed Care – PPO | Admitting: Internal Medicine

## 2011-01-10 VITALS — BP 160/98 | HR 51 | Temp 98.0°F | Ht 70.0 in | Wt 298.0 lb

## 2011-01-10 DIAGNOSIS — I10 Essential (primary) hypertension: Secondary | ICD-10-CM

## 2011-01-10 LAB — BASIC METABOLIC PANEL
Calcium: 9.2 mg/dL (ref 8.4–10.5)
Creatinine, Ser: 1.1 mg/dL (ref 0.4–1.5)

## 2011-01-10 NOTE — Assessment & Plan Note (Signed)
BP Readings from Last 3 Encounters:  01/10/11 160/98  12/27/10 192/116  06/14/10 140/100   Repeat by me 156/108 on right Limited in choices due to trouble with CCB amlodipine and can't take thiazide due to gout Had been using energy drinks Poor lifestyle which he is going to work on  Will stick with current meds See back 2 months---consider alpha blocker at that time

## 2011-01-10 NOTE — Progress Notes (Signed)
  Subjective:    Patient ID: Michaele Offer, male    DOB: 03/22/1955, 55 y.o.   MRN: [id number]  HPI Felt funny for a couple of days on the new and increased meds Feels good now  Plans to take all of Christmas week off Will sleep more and go to beach Has lots of stress with work deadlines, etc  No headaches No chest pain No SOB  Current Outpatient Prescriptions on File Prior to Visit  Medication Sig Dispense Refill  . atenolol (TENORMIN) 100 MG tablet Take 1 tablet (100 mg total) by mouth daily.  90 tablet  3  . Cholecalciferol (VITAMIN D3) 2000 UNITS TABS Take 1 tablet by mouth 2 (two) times daily.        Marland Kitchen losartan (COZAAR) 100 MG tablet Take 1 tablet (100 mg total) by mouth daily.  30 tablet  11  . spironolactone (ALDACTONE) 50 MG tablet Take 1 tablet (50 mg total) by mouth daily.  30 tablet  3    Allergies  Allergen Reactions  . Amlodipine Besylate     REACTION: felt bad  . Bisoprolol-Hydrochlorothiazide     REACTION: Syncope  . Irbesartan     REACTION: BP elevated  . Lisinopril-Hydrochlorothiazide     REACTION: Cough    Past Medical History  Diagnosis Date  . Hypertension   . Gout   . Cancer     prostate    Past Surgical History  Procedure Date  . Prostatectomy 01/24/2006    Family History  Problem Relation Age of Onset  . Cancer Mother 71    breast  . Cancer Father     colon  . Hypertension Father     History   Social History  . Marital Status: Single    Spouse Name: N/A    Number of Children: 0  . Years of Education: N/A   Occupational History  . Drafter     [employer]   Social History Main Topics  . Smoking status: Never Smoker   . Smokeless tobacco: Never Used  . Alcohol Use: 0.0 oz/week    4-6 Cans of beer per week  . Drug Use: No  . Sexually Active: Not on file   Other Topics Concern  . Not on file   Social History Narrative  . No narrative on file   Review of Systems Appetite is fine---"hungry all the  time" Weight is down a few pounds Sleeps fairly well --nocturia x 1 at times     Objective:   Physical Exam  Constitutional: He appears well-developed and well-nourished. No distress.  Neck: Normal range of motion. Neck supple. No thyromegaly present.  Cardiovascular: Normal rate, regular rhythm and normal heart sounds.  Exam reveals no gallop.   No murmur heard. Pulmonary/Chest: Effort normal and breath sounds normal. No respiratory distress. He has no wheezes. He has no rales.  Musculoskeletal: He exhibits no edema and no tenderness.  Lymphadenopathy:    He has no cervical adenopathy.  Psychiatric: He has a normal mood and affect. His behavior is normal. Judgment and thought content normal.          Assessment & Plan:

## 2011-03-09 ENCOUNTER — Ambulatory Visit (INDEPENDENT_AMBULATORY_CARE_PROVIDER_SITE_OTHER): Payer: BC Managed Care – PPO | Admitting: Internal Medicine

## 2011-03-09 ENCOUNTER — Encounter: Payer: Self-pay | Admitting: Internal Medicine

## 2011-03-09 VITALS — BP 150/92 | HR 60 | Temp 97.6°F | Ht 70.0 in | Wt 289.0 lb

## 2011-03-09 DIAGNOSIS — I10 Essential (primary) hypertension: Secondary | ICD-10-CM

## 2011-03-09 LAB — BASIC METABOLIC PANEL
BUN: 21 mg/dL (ref 6–23)
Creatinine, Ser: 1.3 mg/dL (ref 0.4–1.5)
GFR: 62.67 mL/min (ref >60.00)

## 2011-03-09 NOTE — Assessment & Plan Note (Signed)
BP Readings from Last 3 Encounters:  03/09/11 150/92  01/10/11 160/98  12/27/10 192/116   Clearly better Repeat 136/94 on right No changes Recheck potassium Continue to work on lifestyle

## 2011-03-09 NOTE — Progress Notes (Signed)
  Subjective:    Patient ID: Francisco Freeman, male    DOB: [birth year], 56 y.o.   MRN: [id number]  HPI Doing okay Has worked on lifestyle Weight is down 9#!  Can tell a difference on spironolactone Less swelling esp in fingers Did check BP at first and wasn't down so he stopped checking  No headaches No chest pain No SOB  Current Outpatient Prescriptions on File Prior to Visit  Medication Sig Dispense Refill  . atenolol (TENORMIN) 100 MG tablet Take 1 tablet (100 mg total) by mouth daily.  90 tablet  3  . Cholecalciferol (VITAMIN D3) 2000 UNITS TABS Take 1 tablet by mouth 2 (two) times daily.        Marland Kitchen losartan (COZAAR) 100 MG tablet Take 1 tablet (100 mg total) by mouth daily.  30 tablet  11  . spironolactone (ALDACTONE) 50 MG tablet Take 1 tablet (50 mg total) by mouth daily.  30 tablet  3    Allergies  Allergen Reactions  . Amlodipine Besylate     REACTION: felt bad  . Bisoprolol-Hydrochlorothiazide     REACTION: Syncope  . Irbesartan     REACTION: BP elevated  . Lisinopril-Hydrochlorothiazide     REACTION: Cough    Past Medical History  Diagnosis Date  . Hypertension   . Gout   . Cancer     prostate    Past Surgical History  Procedure Date  . Prostatectomy 01/24/2006    Family History  Problem Relation Age of Onset  . Cancer Mother 49    breast  . Cancer Father     colon  . Hypertension Father     History   Social History  . Marital Status: Single    Spouse Name: N/A    Number of Children: 0  . Years of Education: N/A   Occupational History  . Drafter     [employer]   Social History Main Topics  . Smoking status: Never Smoker   . Smokeless tobacco: Never Used  . Alcohol Use: 0.0 oz/week    4-6 Cans of beer per week  . Drug Use: No  . Sexually Active: Not on file   Other Topics Concern  . Not on file   Social History Narrative  . No narrative on file   Review of Systems Has noticed some constipation Had somem LLQ  pain--took some docusate    Objective:   Physical Exam  Constitutional: He appears well-developed and well-nourished. No distress.  Neck: Normal range of motion. Neck supple.  Cardiovascular: Normal rate, regular rhythm and normal heart sounds.  Exam reveals no gallop.   No murmur heard. Pulmonary/Chest: Effort normal and breath sounds normal. No respiratory distress. He has no wheezes. He has no rales.  Musculoskeletal: He exhibits no edema and no tenderness.  Lymphadenopathy:    He has no cervical adenopathy.  Psychiatric: He has a normal mood and affect. His behavior is normal. Judgment and thought content normal.          Assessment & Plan:

## 2011-04-15 ENCOUNTER — Other Ambulatory Visit: Payer: Self-pay | Admitting: Internal Medicine

## 2011-05-10 ENCOUNTER — Other Ambulatory Visit: Payer: Self-pay | Admitting: Internal Medicine

## 2011-06-05 ENCOUNTER — Other Ambulatory Visit: Payer: Self-pay | Admitting: Internal Medicine

## 2011-06-19 ENCOUNTER — Other Ambulatory Visit: Payer: Self-pay | Admitting: Internal Medicine

## 2011-07-26 ENCOUNTER — Other Ambulatory Visit: Payer: Self-pay

## 2011-07-26 MED ORDER — SPIRONOLACTONE 50 MG PO TABS: 50.0000 mg | ORAL_TABLET | Freq: Every day | ORAL | Status: DC

## 2011-07-26 NOTE — Telephone Encounter (Signed)
Pt request refill spironolactone # 30 x 3 to [facility]. Pt notified while on phone.

## 2011-09-16 ENCOUNTER — Encounter: Payer: BC Managed Care – PPO | Admitting: Internal Medicine

## 2012-03-27 ENCOUNTER — Encounter: Payer: BC Managed Care – PPO | Admitting: Internal Medicine

## 2012-10-03 ENCOUNTER — Emergency Department: Payer: Self-pay

## 2012-10-03 LAB — URINALYSIS, COMPLETE
Hyaline Cast: 20
Nitrite: NEGATIVE
Protein: >=500

## 2012-10-03 LAB — COMPREHENSIVE METABOLIC PANEL
Albumin: 3.8 g/dL (ref 3.4–5.0)
Bilirubin,Total: 0.6 mg/dL (ref 0.2–1.0)
Chloride: 106 mmol/L (ref 98–107)
Creatinine: 1.43 mg/dL — ABNORMAL HIGH (ref 0.60–1.30)
EGFR (African American): >60
Glucose: 125 mg/dL — ABNORMAL HIGH (ref 65–99)
Osmolality: 279 (ref 275–301)
Potassium: 4.3 mmol/L (ref 3.5–5.1)
SGOT(AST): 33 U/L (ref 15–37)

## 2012-10-03 LAB — CBC
HCT: 53.2 % — ABNORMAL HIGH (ref 40.0–52.0)
MCV: 89 fL (ref 80–100)
Platelet: 181 10*3/uL (ref 150–440)
RBC: 5.99 10*6/uL — ABNORMAL HIGH (ref 4.40–5.90)
WBC: 11.7 10*3/uL — ABNORMAL HIGH (ref 3.8–10.6)

## 2014-08-03 IMAGING — CR DG CHEST 2V
1 series · 2 of 2 positions shown · non-contrast
Comparison: none

REASON FOR EXAM: hypoxia while laying down
COMMENTS:

[Series 1: w chest pa · 0.14mm/px · 2 of 2 slices shown]
[im 1/2]
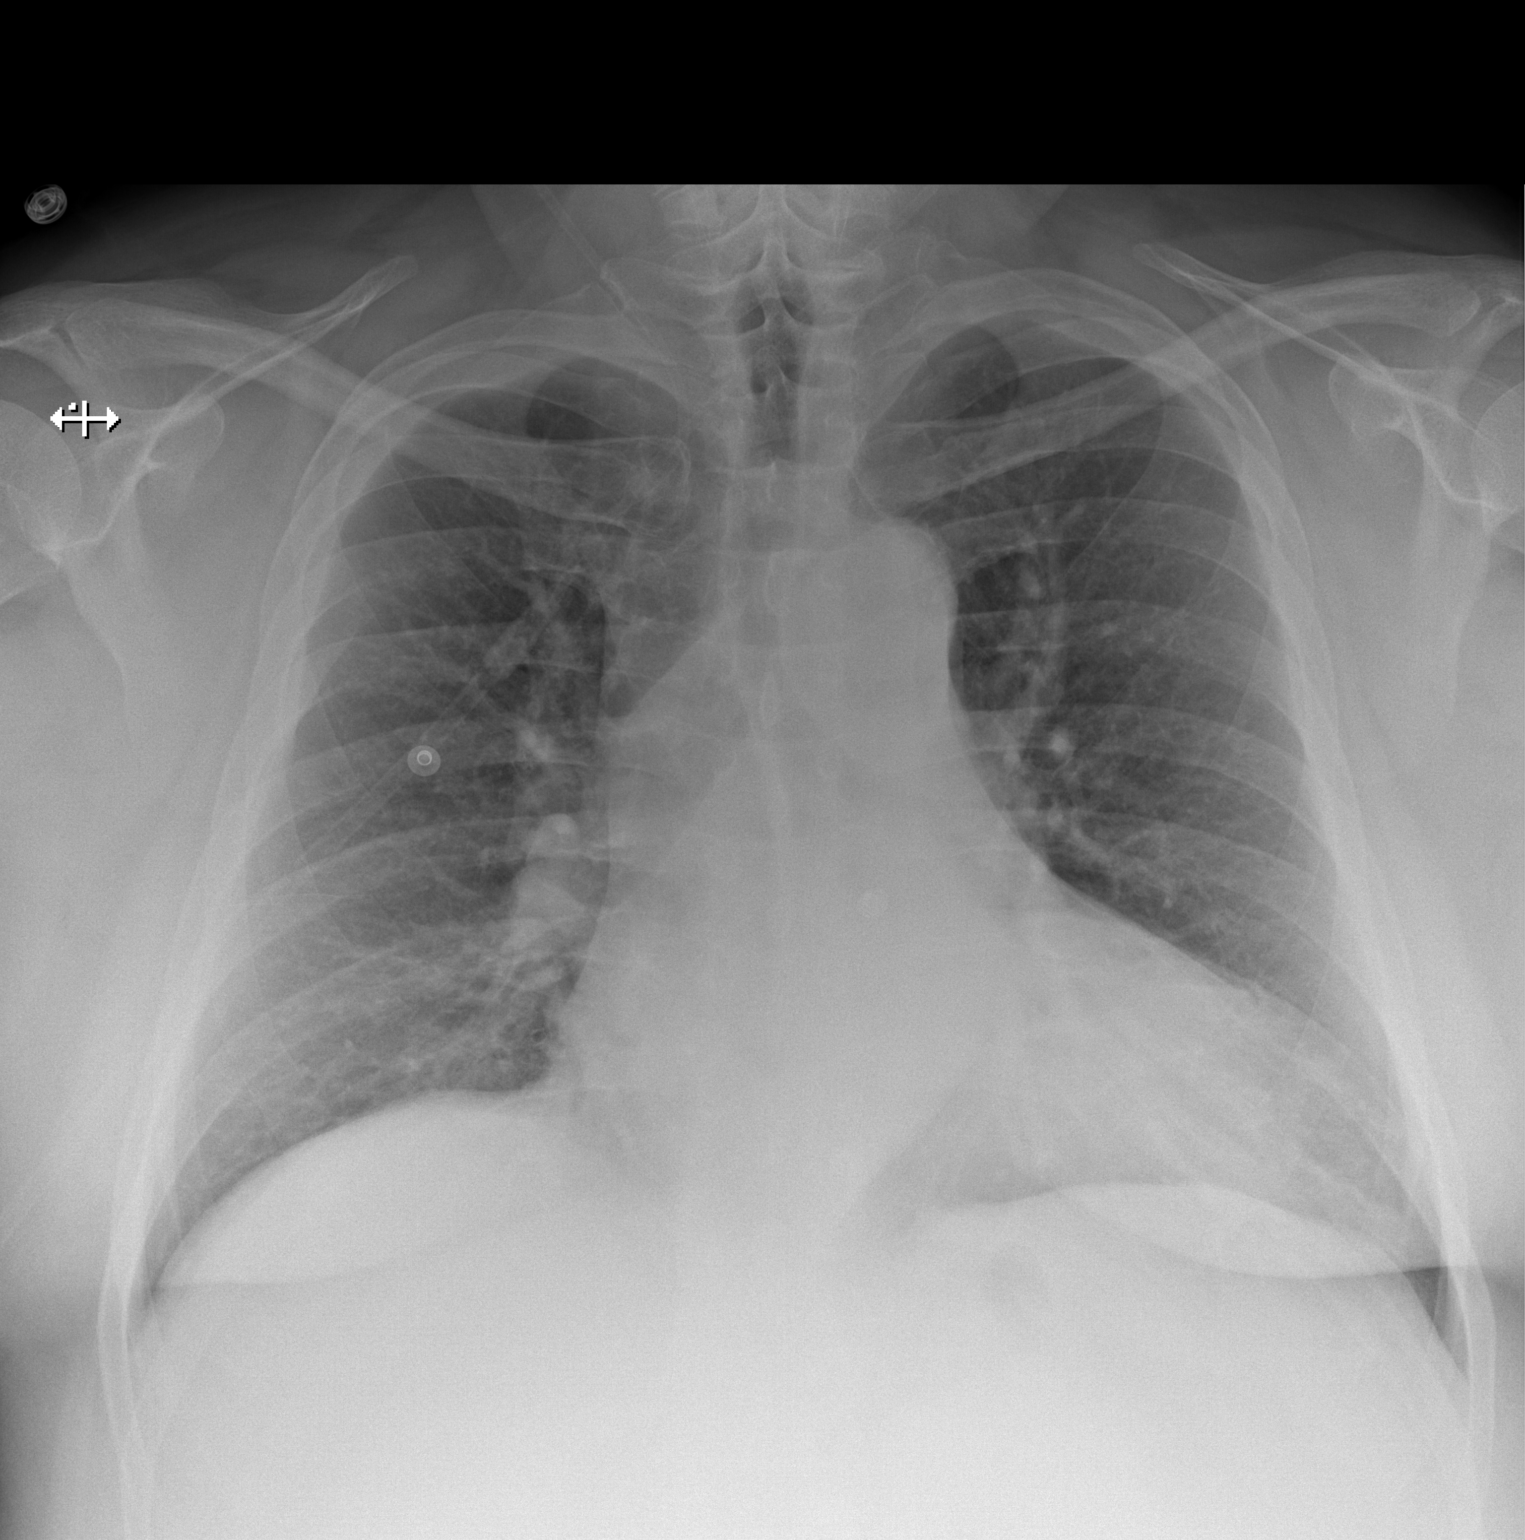
[im 2/2]
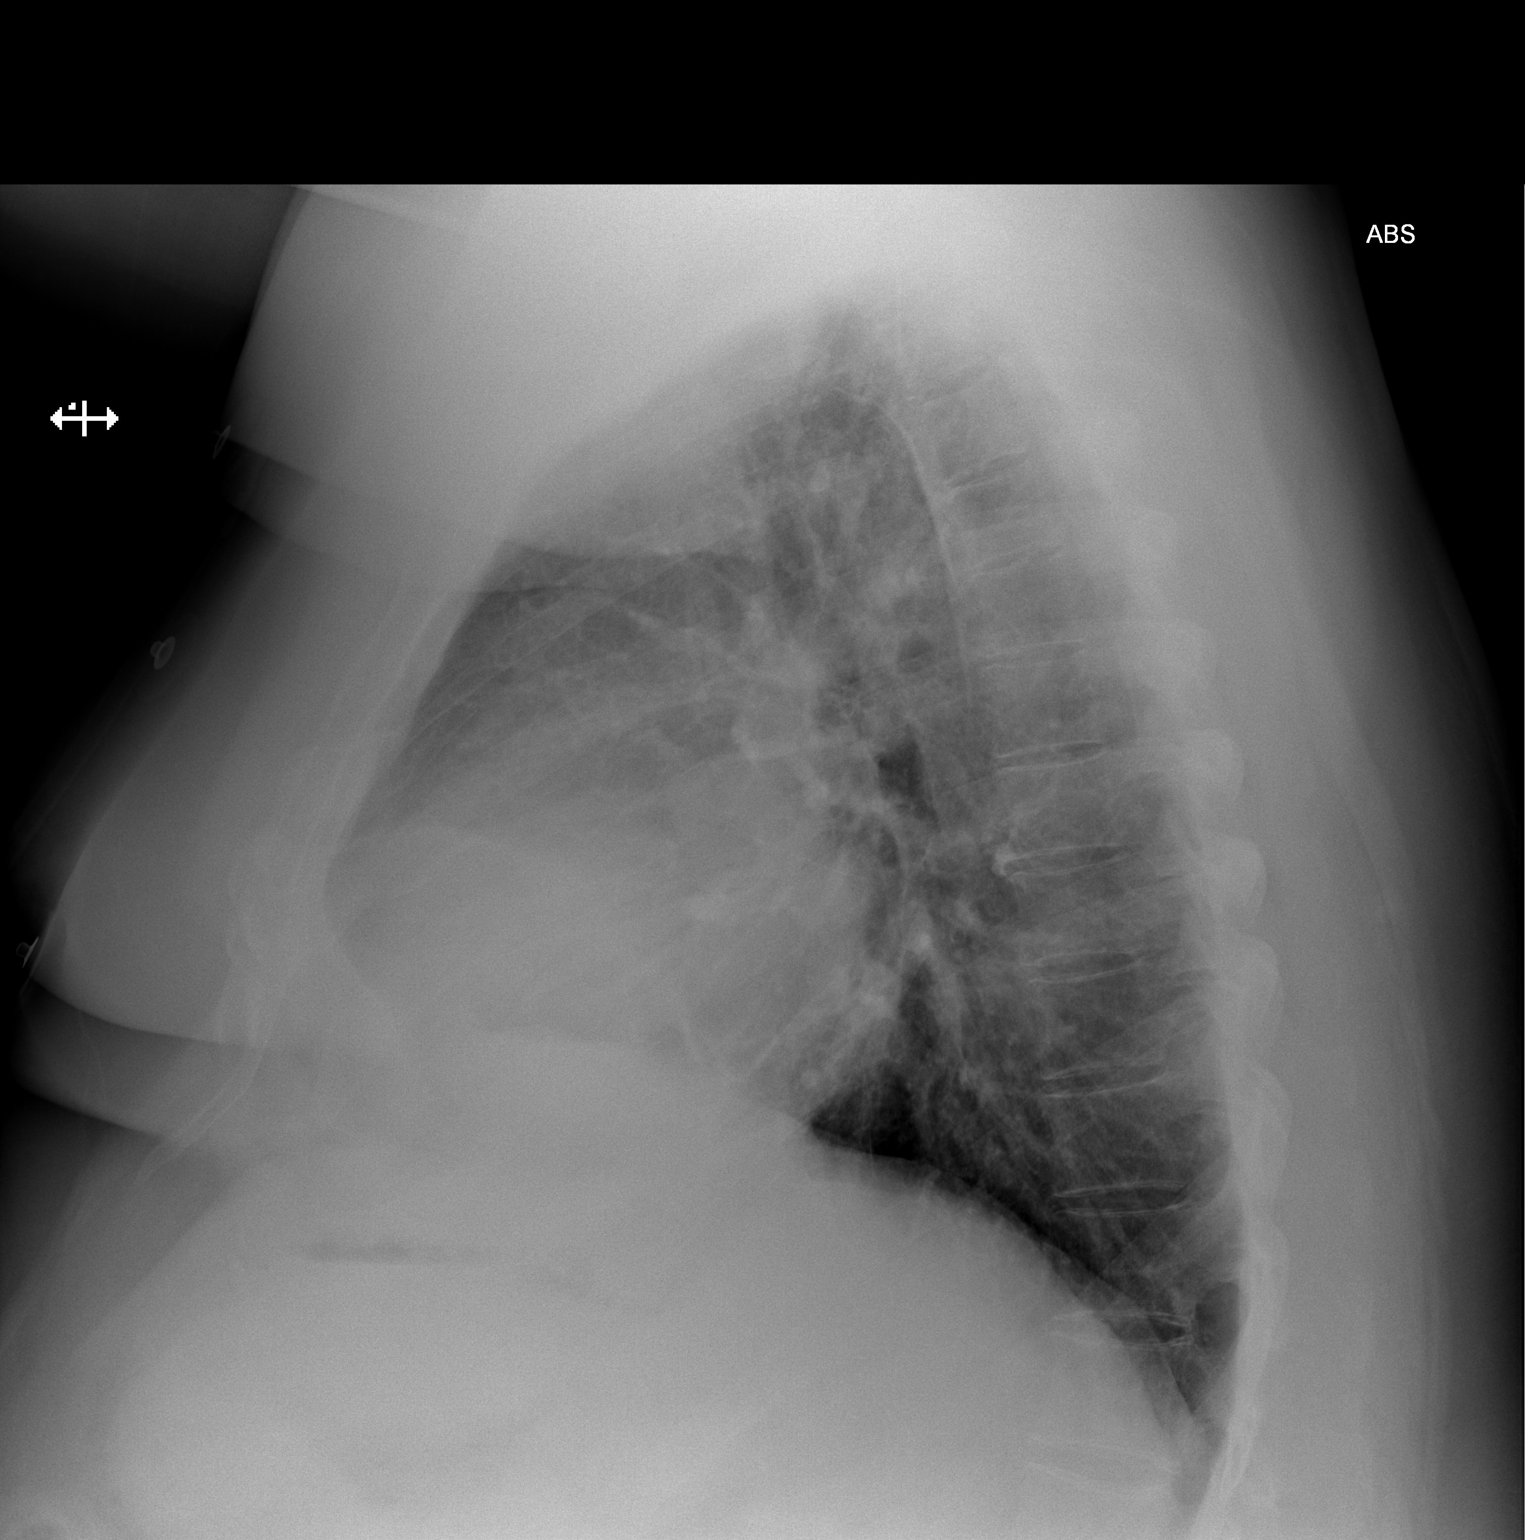

[2 of 2 positions shown; findings below may reference images not displayed]

PROCEDURE:     DXR - DXR CHEST PA (OR AP) AND LATERAL  - October 03, 2012  [DATE]

RESULT:     The lungs are adequately inflated. The cardiac silhouette is
enlarged. The central pulmonary vascularity is prominent. Interstitial
markings are mildly prominent. There is tortuosity of the descending
thoracic aorta. There is no pleural effusion.
IMPRESSION: There is enlargement of the cardiac silhouette with
prominence of the pulmonary vascularity. This could reflect low-grade
compensated CHF. There is no focal pneumonia.

[REDACTED]

## 2016-12-13 ENCOUNTER — Encounter: Payer: Self-pay | Admitting: Radiation Oncology

## 2016-12-27 ENCOUNTER — Encounter: Payer: Self-pay | Admitting: *Deleted

## 2016-12-28 ENCOUNTER — Encounter
Admission: RE | Disposition: A | Discharge status: Home / Self Care | Payer: Self-pay | Source: Ambulatory Visit | Attending: Unknown Physician Specialty

## 2016-12-28 ENCOUNTER — Ambulatory Visit: Payer: BLUE CROSS/BLUE SHIELD | Admitting: Anesthesiology

## 2016-12-28 ENCOUNTER — Other Ambulatory Visit: Payer: Self-pay

## 2016-12-28 ENCOUNTER — Encounter: Payer: Self-pay | Admitting: *Deleted

## 2016-12-28 ENCOUNTER — Ambulatory Visit
Admission: RE | Admit: 2016-12-28 | Discharge: 2016-12-28 | Disposition: A | Discharge status: Home / Self Care | Payer: BLUE CROSS/BLUE SHIELD | Source: Ambulatory Visit | Attending: Unknown Physician Specialty | Admitting: Unknown Physician Specialty

## 2016-12-28 DIAGNOSIS — Z1211 Encounter for screening for malignant neoplasm of colon: Secondary | ICD-10-CM | POA: Diagnosis present

## 2016-12-28 DIAGNOSIS — I10 Essential (primary) hypertension: Secondary | ICD-10-CM | POA: Insufficient documentation

## 2016-12-28 DIAGNOSIS — Z800 Family history of malignant neoplasm of digestive organs: Secondary | ICD-10-CM | POA: Insufficient documentation

## 2016-12-28 DIAGNOSIS — Z79899 Other long term (current) drug therapy: Secondary | ICD-10-CM | POA: Insufficient documentation

## 2016-12-28 DIAGNOSIS — K5730 Diverticulosis of large intestine without perforation or abscess without bleeding: Secondary | ICD-10-CM | POA: Diagnosis not present

## 2016-12-28 DIAGNOSIS — Z6841 Body Mass Index (BMI) 40.0 and over, adult: Secondary | ICD-10-CM | POA: Diagnosis not present

## 2016-12-28 DIAGNOSIS — K640 First degree hemorrhoids: Secondary | ICD-10-CM | POA: Insufficient documentation

## 2016-12-28 DIAGNOSIS — Z8546 Personal history of malignant neoplasm of prostate: Secondary | ICD-10-CM | POA: Insufficient documentation

## 2016-12-28 DIAGNOSIS — D123 Benign neoplasm of transverse colon: Secondary | ICD-10-CM | POA: Insufficient documentation

## 2016-12-28 HISTORY — DX: Edema, unspecified: R60.9

## 2016-12-28 HISTORY — PX: COLONOSCOPY: SHX5424

## 2016-12-28 HISTORY — DX: Gout, unspecified: M10.9

## 2016-12-28 HISTORY — DX: Calculus of kidney: N20.0

## 2016-12-28 SURGERY — COLONOSCOPY
Anesthesia: General

## 2016-12-28 MED ORDER — EPHEDRINE SULFATE 50 MG/ML IJ SOLN
INTRAMUSCULAR | Status: AC
  Filled 2016-12-28: qty 1

## 2016-12-28 MED ORDER — PROPOFOL 500 MG/50ML IV EMUL
INTRAVENOUS | Status: DC | PRN
  Administered 2016-12-28: 120 ug/kg/min via INTRAVENOUS

## 2016-12-28 MED ORDER — PHENYLEPHRINE HCL 10 MG/ML IJ SOLN
INTRAMUSCULAR | Status: DC | PRN
  Administered 2016-12-28: 200 ug via INTRAVENOUS

## 2016-12-28 MED ORDER — PROPOFOL 500 MG/50ML IV EMUL
INTRAVENOUS | Status: AC
  Filled 2016-12-28: qty 50

## 2016-12-28 MED ORDER — MIDAZOLAM HCL 2 MG/2ML IJ SOLN
INTRAMUSCULAR | Status: DC | PRN
  Administered 2016-12-28: 1 mg via INTRAVENOUS

## 2016-12-28 MED ORDER — LIDOCAINE HCL (PF) 2 % IJ SOLN
INTRAMUSCULAR | Status: AC
  Filled 2016-12-28: qty 10

## 2016-12-28 MED ORDER — MIDAZOLAM HCL 2 MG/2ML IJ SOLN
INTRAMUSCULAR | Status: AC
  Filled 2016-12-28: qty 2

## 2016-12-28 MED ORDER — PROPOFOL 10 MG/ML IV BOLUS
INTRAVENOUS | Status: DC | PRN
  Administered 2016-12-28: 60 mg via INTRAVENOUS

## 2016-12-28 MED ORDER — LIDOCAINE HCL (CARDIAC) 20 MG/ML IV SOLN
INTRAVENOUS | Status: DC | PRN
  Administered 2016-12-28: 100 mg via INTRAVENOUS

## 2016-12-28 MED ORDER — SODIUM CHLORIDE 0.9 % IV SOLN
INTRAVENOUS | Status: DC
  Administered 2016-12-28: 1000 mL via INTRAVENOUS
  Administered 2016-12-28: 15:00:00 via INTRAVENOUS

## 2016-12-28 MED ORDER — SODIUM CHLORIDE 0.9 % IV SOLN: INTRAVENOUS | Status: DC

## 2016-12-28 MED ORDER — EPHEDRINE SULFATE 50 MG/ML IJ SOLN
INTRAMUSCULAR | Status: DC | PRN
  Administered 2016-12-28: 10 mg via INTRAVENOUS

## 2016-12-28 NOTE — Transfer of Care (Signed)
Immediate Anesthesia Transfer of Care Note  Patient: Francisco Freeman  Procedure(s) Performed: COLONOSCOPY (N/A )  Patient Location: Endoscopy Unit  Anesthesia Type:General  Level of Consciousness: drowsy and patient cooperative  Airway & Oxygen Therapy: Patient Spontanous Breathing and Patient connected to nasal cannula oxygen  Post-op Assessment: Report given to RN, Post -op Vital signs reviewed and stable and Patient moving all extremities X 4  Post vital signs: Reviewed and stable  Last Vitals:  Vitals:   12/28/16 1416 12/28/16 1602  BP: (!) 156/90   Pulse: 64   Resp: 20   Temp: (!) 36.2 C 36.4 C  SpO2: 95%     Last Pain:  Vitals:   12/28/16 1602  TempSrc: Axillary         Complications: No apparent anesthesia complications

## 2016-12-28 NOTE — Op Note (Signed)
[facility] Gastroenterology Patient Name: Francisco Freeman Procedure Date: 12/28/2016 3:21 PM MRN: [id number] Account #: [id number] Date of Birth: 1956-01-08 Admit Type: Outpatient Age: 61 Room: [facility] ENDO ROOM 3 Gender: Male Note Status: Finalized Procedure:            Colonoscopy Indications:          Screening in patient at increased risk: Family history                        of 1st-degree relative with colorectal cancer Providers:            Manya Silvas, MD Referring MD:         Ramonita Lab, MD (Referring MD) Medicines:            Propofol per Anesthesia Complications:        No immediate complications. Procedure:            Pre-Anesthesia Assessment:                       - After reviewing the risks and benefits, the patient                        was deemed in satisfactory condition to undergo the                        procedure.                       After obtaining informed consent, the colonoscope was                        passed under direct vision. Throughout the procedure,                        the patient's blood pressure, pulse, and oxygen                        saturations were monitored continuously. The                        Colonoscope was introduced through the anus and                        advanced to the the cecum, identified by appendiceal                        orifice and ileocecal valve. The colonoscopy was                        somewhat difficult due to the patient's body habitus.                        Successful completion of the procedure was aided by                        applying abdominal pressure. The patient tolerated the                        procedure well. The quality of the bowel preparation  was adequate to identify polyps. Findings:      A small polyp was found in the cecum. The polyp was sessile. The polyp       was removed with a hot snare. Resection was complete, but the polyp   tissue was not retrieved.      A small polyp was found in the hepatic flexure. The polyp was sessile.       The polyp was removed with a hot snare. Resection and retrieval were       complete. To prevent bleeding after the polypectomy, two hemostatic       clips were successfully placed. There was no bleeding during, or at the       end, of the procedure.      Multiple small and large-mouthed diverticula were found in the sigmoid       colon and descending colon.      Internal hemorrhoids were found during endoscopy. The hemorrhoids were       small and Grade I (internal hemorrhoids that do not prolapse). Impression:           - One small polyp in the cecum, removed with a hot                        snare. Complete resection. Polyp tissue not retrieved.                       - One small polyp at the hepatic flexure, removed with                        a hot snare. Resected and retrieved. Clips were placed.                       - Diverticulosis in the sigmoid colon and in the                        descending colon.                       - Internal hemorrhoids. Recommendation:       - Await pathology results. Manya Silvas, MD 12/28/2016 4:00:50 PM This report has been signed electronically. Number of Addenda: 0 Note Initiated On: 12/28/2016 3:21 PM Scope Withdrawal Time: 0 hours 18 minutes 23 seconds  Total Procedure Duration: 0 hours 31 minutes 0 seconds       [facility]

## 2016-12-28 NOTE — H&P (Signed)
Primary Care Physician:  Adin Hector, MD Primary Gastroenterologist:  Dr. Vira Agar  Pre-Procedure History & Physical: HPI:  Francisco Freeman is a 61 y.o. male is here for an colonoscopy.   Past Medical History:  Diagnosis Date  . Cancer [facility])    prostate  . Colon cancer ([place])   . Edema   . Gout   . Gout   . Hypertension   . Renal stones     Past Surgical History:  Procedure Laterality Date  . COLONOSCOPY    . PROSTATE SURGERY     robot assisted prostatectomy  . PROSTATECTOMY  01/24/2006    Prior to Admission medications   Medication Sig Start Date End Date Taking? Authorizing Provider  Ascorbic Acid 500 MG CHEW Chew 500 mg by mouth daily.   Yes [provider]  atenolol (TENORMIN) 100 MG tablet TAKE 1 TABLET BY MOUTH EVERY DAY 06/05/11  Yes Venia Carbon, MD  Cholecalciferol (VITAMIN D3) 2000 UNITS TABS Take 1 tablet by mouth 2 (two) times daily.     Yes [provider]  furosemide (LASIX) 40 MG tablet Take 40 mg by mouth daily as needed.   Yes [provider]  hydrALAZINE (APRESOLINE) 100 MG tablet Take 100 mg by mouth 2 (two) times daily.   Yes [provider]  potassium chloride (K-DUR,KLOR-CON) 10 MEQ tablet Take 10 mEq by mouth daily.   Yes [provider]  losartan (COZAAR) 100 MG tablet Take 1 tablet (100 mg total) by mouth daily. 12/27/10 12/27/11  Venia Carbon, MD  spironolactone (ALDACTONE) 50 MG tablet Take 1 tablet (50 mg total) by mouth daily. 07/26/11   Venia Carbon, MD    Allergies as of 11/07/2016 - Review Complete 03/09/2011  Allergen Reaction Noted  . Amlodipine besylate  11/22/2006  . Bisoprolol-hydrochlorothiazide    . Irbesartan    . Lisinopril-hydrochlorothiazide      Family History  Problem Relation Age of Onset  . Cancer Mother 23       breast  . Cancer Father        colon  . Hypertension Father     Social History   Socioeconomic History  . Marital status: Single    Spouse name: Not on file  . Number of children: 0  . Years of education: Not on file  . Highest education level: Not on file  Social Needs  . Financial resource strain: Not on file  . Food insecurity - worry: Not on file  . Food insecurity - inability: Not on file  . Transportation needs - medical: Not on file  . Transportation needs - non-medical: Not on file  Occupational History  . Occupation: Drafter    Comment: Civil engineer, contracting  Tobacco Use  . Smoking status: Never Smoker  . Smokeless tobacco: Never Used  Substance and Sexual Activity  . Alcohol use: Yes    Alcohol/week: 0.6 oz    Types: 1 Cans of beer per week    Comment: once a week  . Drug use: No  . Sexual activity: Not on file  Other Topics Concern  . Not on file  Social History Narrative  . Not on file    Review of Systems: See HPI, otherwise negative ROS  Physical Exam: BP (!) 156/90   Pulse 64   Temp (!) 97.2 F (36.2 C) (Tympanic)   Resp 20   Ht 5' 10.5" (1.791 m)   Wt (!) 140.6 kg (310 lb)  SpO2 95%   BMI 43.85 kg/m  General:   Alert,  pleasant and cooperative in NAD Head:  Normocephalic and atraumatic. Neck:  Supple; no masses or thyromegaly. Lungs:  Clear throughout to auscultation.    Heart:  Regular rate and rhythm. Abdomen:  Soft, nontender and nondistended. Normal bowel sounds, without guarding, and without rebound.   Neurologic:  Alert and  oriented x4;  grossly normal neurologically.  Impression/Plan: Francisco Freeman is here for an colonoscopy to be performed for FH colon cancer in father.  Risks, benefits, limitations, and alternatives regarding  colonoscopy have been reviewed with the patient.  Questions have been answered.  All parties agreeable.   Gaylyn Cheers, MD  12/28/2016, 3:17 PM

## 2016-12-28 NOTE — Anesthesia Post-op Follow-up Note (Signed)
Anesthesia QCDR form completed.        

## 2016-12-28 NOTE — Brief Op Note (Signed)
Cecum polyp hot snare not retrieved during colonoscopy

## 2016-12-28 NOTE — Anesthesia Postprocedure Evaluation (Signed)
Anesthesia Post Note  Patient: Francisco Freeman  Procedure(s) Performed: COLONOSCOPY (N/A )  Patient location during evaluation: PACU Anesthesia Type: General Level of consciousness: awake and alert and oriented Pain management: pain level controlled Vital Signs Assessment: post-procedure vital signs reviewed and stable Respiratory status: spontaneous breathing Cardiovascular status: blood pressure returned to baseline Anesthetic complications: no     Last Vitals:  Vitals:   12/28/16 1416 12/28/16 1602  BP: (!) 156/90   Pulse: 64   Resp: 20   Temp: (!) 36.2 C 36.4 C  SpO2: 95%     Last Pain:  Vitals:   12/28/16 1602  TempSrc: Axillary                 Kiersten Coss

## 2016-12-28 NOTE — Anesthesia Preprocedure Evaluation (Signed)
Anesthesia Evaluation  Patient identified by MRN, date of birth, ID band Patient awake    Reviewed: Allergy & Precautions, NPO status , Patient's Chart, lab work & pertinent test results  History of Anesthesia Complications Negative for: history of anesthetic complications  Airway Mallampati: II       Dental   Pulmonary neg sleep apnea, neg COPD,           Cardiovascular hypertension, Pt. on medications (-) Past MI and (-) CHF (-) dysrhythmias (-) Valvular Problems/Murmurs     Neuro/Psych neg Seizures    GI/Hepatic Neg liver ROS, neg GERD  ,  Endo/Other  neg diabetesMorbid obesity  Renal/GU Renal disease     Musculoskeletal   Abdominal   Peds  Hematology   Anesthesia Other Findings   Reproductive/Obstetrics                             Anesthesia Physical Anesthesia Plan  ASA: III  Anesthesia Plan:    Post-op Pain Management:    Induction:   PONV Risk Score and Plan: 1  Airway Management Planned: Nasal Cannula  Additional Equipment:   Intra-op Plan:   Post-operative Plan:   Informed Consent: I have reviewed the patients History and Physical, chart, labs and discussed the procedure including the risks, benefits and alternatives for the proposed anesthesia with the patient or authorized representative who has indicated his/her understanding and acceptance.     Plan Discussed with:   Anesthesia Plan Comments:         Anesthesia Quick Evaluation

## 2016-12-29 ENCOUNTER — Encounter: Payer: Self-pay | Admitting: Unknown Physician Specialty

## 2016-12-30 LAB — SURGICAL PATHOLOGY

## 2017-01-04 ENCOUNTER — Ambulatory Visit: Payer: BLUE CROSS/BLUE SHIELD | Admitting: Radiation Oncology

## 2017-01-04 ENCOUNTER — Encounter: Payer: Self-pay | Admitting: Radiation Oncology

## 2017-01-04 NOTE — Progress Notes (Signed)
GU Location of Tumor / Histology: biochemical recurrent prostate cancer, adenocarcinoma  If Prostate Cancer, Gleason Score is (3 + 4) and PSA is (5.8) pre treatment.  Francisco Freeman had a radical prostatectomy in January 2008. Margin status: positive (focal-L post)    Past/Anticipated interventions by urology, if any: prostatectomy, routine PSA checks, referral to Dr. Tammi Klippel for consideration of salvage radiation therapy, encouraged to consider a short course of ADT  03/20/2006 PSA  0.02 12/06/2012 PSA  0.06 06/24/2016 PSA  0.14 12/05/2016 PSA  0.18  Past/Anticipated interventions by medical oncology, if any: no  Weight changes, if any: no  Bowel/Bladder complaints, if any: IPSS 3 with urgency and nocturia x 1. Reports incontinence s/p prostatectomy has resolved  Nausea/Vomiting, if any: no  Pain issues, if any:  no  SAFETY ISSUES:  Prior radiation? no  Pacemaker/ICD? no  Possible current pregnancy? no  Is the patient on methotrexate? no  Current Complaints / other details:  61 year old male. Single. Mother with hx of breast ca. Father with hx of colon ca. Patient lives in [place], [place]

## 2017-01-05 ENCOUNTER — Encounter: Payer: Self-pay | Admitting: Radiation Oncology

## 2017-01-05 ENCOUNTER — Ambulatory Visit
Admission: RE | Admit: 2017-01-05 | Discharge: 2017-01-05 | Disposition: A | Discharge status: Home / Self Care | Payer: BLUE CROSS/BLUE SHIELD | Source: Ambulatory Visit | Attending: Radiation Oncology | Admitting: Radiation Oncology

## 2017-01-05 ENCOUNTER — Other Ambulatory Visit: Payer: Self-pay

## 2017-01-05 VITALS — BP 141/82 | HR 56 | Resp 18 | Ht 70.5 in | Wt 302.0 lb

## 2017-01-05 DIAGNOSIS — C61 Malignant neoplasm of prostate: Secondary | ICD-10-CM | POA: Insufficient documentation

## 2017-01-05 DIAGNOSIS — M109 Gout, unspecified: Secondary | ICD-10-CM | POA: Insufficient documentation

## 2017-01-05 DIAGNOSIS — Z510 Encounter for antineoplastic radiation therapy: Secondary | ICD-10-CM | POA: Diagnosis not present

## 2017-01-05 DIAGNOSIS — M129 Arthropathy, unspecified: Secondary | ICD-10-CM | POA: Diagnosis not present

## 2017-01-05 DIAGNOSIS — I10 Essential (primary) hypertension: Secondary | ICD-10-CM | POA: Insufficient documentation

## 2017-01-05 DIAGNOSIS — Z800 Family history of malignant neoplasm of digestive organs: Secondary | ICD-10-CM | POA: Diagnosis not present

## 2017-01-05 DIAGNOSIS — R609 Edema, unspecified: Secondary | ICD-10-CM | POA: Insufficient documentation

## 2017-01-05 HISTORY — DX: Unspecified osteoarthritis, unspecified site: M19.90

## 2017-01-05 HISTORY — DX: Malignant neoplasm of prostate: C61

## 2017-01-05 NOTE — Progress Notes (Signed)
See progress note under physician encounter. 

## 2017-01-05 NOTE — Progress Notes (Signed)
Radiation Oncology         [phone] ________________________________  Initial outpatient Consultation  Name: Francisco Freeman MRN: [id number]  Date: 01/05/2017  DOB: 03/06/1955  YP:[id number], Tonette Bihari, MD  Raynelle Bring, MD   REFERRING PHYSICIAN: Raynelle Bring, MD  DIAGNOSIS: 61 y.o. gentleman with rising PSA of 0.18, post prostatectomy for adenocarcinoma of the prostate with a Gleason's score of 3+4 with a single focal positive margin    ICD-10-CM   1. Malignant neoplasm of prostate ([place]) [place] ILLNESS::Francisco Freeman is a 61 y.o. gentleman with a history of pT3N0Mx Gleason 3 + 4 prostate cancer initially diagnosed in 2007. He elected to proceed with primary surgical treatment and underwent RALP with BPLND in 01/2006. The patient's pretreatment PSA was 5.8. Final surgical pathology revealed a  focal positive margin at the left posterior lobe resection. Bilateral seminal vesicles and bladder neck margin were free of tumor. There was one enlarged lymph node on the right without evidence of metastatic carcinoma. The resected left lymph node was negative as well.  Post-treatment PSA became undetectable and remained undetected until recent. He last saw Dr. Alinda Money in 2014 and since that time, has continued to follow up with his PCP, Dr. Caryl Comes for PSA surveillance until June of 2018 when the patient's PSA was noted to have a steady elevation and he was referred back to Dr. Alinda Money for further evaluation.  PSA trend: PSA fall of 2017 was 0.08 02/2016 - 0.135 03/2016 - 0.15 06/24/2016 - 0.14 12/05/2016 - 0.18  Pt reports a significant family history of cancer with his mother having breast cancer and his father with a history of colon cancer.  He has reviewed his recent PSA results with this urologist and has kindly been referred to discuss the potential role for salvage radiotherapy to the prostatic fossa in the treatment of his disease.  PREVIOUS  RADIATION THERAPY: No  PAST MEDICAL HISTORY:  has a past medical history of Arthritis, Edema, Gout, Gout, Hypertension, Prostate cancer ([place]), and Renal stones.    PAST SURGICAL HISTORY: Past Surgical History:  Procedure Laterality Date  . COLONOSCOPY    . COLONOSCOPY N/A 12/28/2016   Procedure: COLONOSCOPY;  Surgeon: Manya Silvas, MD;  Location: [facility] ENDOSCOPY;  Service: Endoscopy;  Laterality: N/A;  . PROSTATE SURGERY     robot assisted prostatectomy  . PROSTATECTOMY  01/24/2006    FAMILY HISTORY: family history includes Cancer in his father; Cancer (age of onset: 83) in his mother; Hypertension in his father.  SOCIAL HISTORY:  reports that  has never smoked. he has never used smokeless tobacco. He reports that he drinks about 0.6 oz of alcohol per week. He reports that he does not use drugs.  ALLERGIES: Amlodipine besylate; Bisoprolol-hydrochlorothiazide; Hydrochlorothiazide; Irbesartan; Lisinopril; and Lisinopril-hydrochlorothiazide  MEDICATIONS:  Current Outpatient Medications  Medication Sig Dispense Refill  . Ascorbic Acid 500 MG CHEW Chew 500 mg by mouth daily.    Marland Kitchen atenolol (TENORMIN) 25 MG tablet Take 25 mg by mouth daily.    . Cholecalciferol (D3 MAXIMUM STRENGTH) 5000 units capsule Take 5,000 Units by mouth daily.    . furosemide (LASIX) 40 MG tablet Take 40 mg by mouth daily as needed.    . hydrALAZINE (APRESOLINE) 100 MG tablet Take 100 mg by mouth 2 (two) times daily.    Marland Kitchen losartan (COZAAR) 100 MG tablet Take 1 tablet (100 mg total) by mouth daily. 30 tablet 11  . potassium  chloride (K-DUR,KLOR-CON) 10 MEQ tablet Take 10 mEq by mouth daily.     No current facility-administered medications for this encounter.     REVIEW OF SYSTEMS:  On review of systems, the patient reports that he is doing well overall. He denies any chest pain, shortness of breath, cough, fevers, chills, night sweats, unintended weight changes. He denies any bowel or bladder disturbances, and  denies abdominal pain, nausea or vomiting. He denies any new musculoskeletal or joint aches or pains, new skin lesions or concerns. He denies any major heart problems requiring pacemaker placement or stents or lung disease, COPD. He believes he has sleep apnea but has not yet had a sleep study to confirm and does not use CPAP currently. The patient completed an IPSS and IIEF questionnaire.  His IPSS score was 3 indicating mild urinary outflow obstructive symptoms.  He has significant ED and uses Viagra as needed.  A complete review of systems is obtained and is otherwise negative.   PHYSICAL EXAM:  height is 5' 10.5" (1.791 m) and weight is 302 lb (137 kg) (abnormal). His blood pressure is 141/82 (abnormal) and his pulse is 56 (abnormal). His respiration is 18 and oxygen saturation is 100%.   In general this is a well appearing caucasian male in no acute distress. He is alert and oriented x4 and appropriate throughout the examination. HEENT reveals that the patient is normocephalic, atraumatic. EOMs are intact. PERRLA. Skin is intact without any evidence of gross lesions. Cardiovascular exam reveals a regular rate and rhythm, no clicks rubs or murmurs are auscultated. Chest is clear to auscultation bilaterally. Lymphatic assessment is performed and does not reveal any adenopathy in the cervical, supraclavicular, axillary, or inguinal chains. Abdomen has active bowel sounds in all quadrants and is intact. The abdomen is soft, non tender, non distended. Lower extremities are negative for pretibial pitting edema, deep calf tenderness, cyanosis or clubbing.   KPS = 100  100 - Normal; no complaints; no evidence of disease. 90   - Able to carry on normal activity; minor signs or symptoms of disease. 80   - Normal activity with effort; some signs or symptoms of disease. 85   - Cares for self; unable to carry on normal activity or to do active work. 60   - Requires occasional assistance, but is able to care for  most of his personal needs. 50   - Requires considerable assistance and frequent medical care. 13   - Disabled; requires special care and assistance. 30   - Severely disabled; hospital admission is indicated although death not imminent. 39   - Very sick; hospital admission necessary; active supportive treatment necessary. 10   - Moribund; fatal processes progressing rapidly. 0     - Dead  Karnofsky DA, [place], Craver LS and Burchenal [facility] [phone]) The use of the nitrogen mustards in the palliative treatment of carcinoma: with particular reference to bronchogenic carcinoma Cancer 1 [phone]   LABORATORY DATA:  Lab Results  Component Value Date   WBC 11.7 (H) 10/03/2012   HGB 18.3 (H) 10/03/2012   HCT 53.2 (H) 10/03/2012   MCV 89 10/03/2012   PLT 181 10/03/2012   Lab Results  Component Value Date   NA 138 10/03/2012   K 4.3 10/03/2012   CL 106 10/03/2012   CO2 26 10/03/2012   Lab Results  Component Value Date   ALT 43 10/03/2012   AST 33 10/03/2012   ALKPHOS 81 10/03/2012   BILITOT 0.6 10/03/2012  RADIOGRAPHY: No results found.    IMPRESSION/PLAN: This gentleman is a 61 y.o. gentleman with a history of pT3N0Mx Gleason 3 + 4 prostate cancer, with a pretreatment PSA of 5.8, now with a rising, detectable PSA of 0.18 s/p RALP 01/2006 with a single focal positive margin.  He may benefit from salvage radiotherapy.  Today, we reviewed the findings and workup thus far. We discussed the natural history of prostate cancer. We reviewed the the implications of positive margins and rising detectable PSA following prostatectomy. We discussed radiation treatment directed to the prostatic fossa with regard to the logistics and delivery of external beam radiotherapy. We discussed and outlined the risks, benefits, short and long-term effects associated with salvage radiotherapy.  At the end of our conversation, the patient elects to proceed with PSA surveillance and has a scheduled follow up  with his PCP, Dr. Caryl Comes in January 2019 for repeat PSA and plans to follow up with Dr. Alinda Money shortly thereafter. He is leaning towards proceeding with salvage radiotherapy in 2019 if the PSA remains in the 0.2 range or higher.  We will share our findings with Dr. Alinda Money and will look forward to following along in his progress.     We enjoyed meeting with him today, and will look forward to participating in the care of this very nice gentleman.   We spent 55 minutes face to face with the patient and more than 50% of that time was spent in counseling and/or coordination of care.     Nicholos Johns, PA-C    Tyler Pita, MD  [facility] Direct Dial: [phone]  Fax: [phone] [email or web address]  Skype  [email or web address]  This document serves as a record of services personally performed by Freeman Caldron, PA-C and Tyler Pita, MD. It was created on their behalf by Valeta Harms, a trained medical scribe. The creation of this record is based on the scribe's personal observations and the providers' statements to them. This document has been checked and approved by the attending provider.

## 2017-01-19 ENCOUNTER — Telehealth: Payer: Self-pay | Admitting: Medical Oncology

## 2017-01-19 NOTE — Telephone Encounter (Signed)
Left a message introducing myself as the prostate nurse navigator and my role. He is post robotic prostatecomy 01/2006. He will follow up with his PCP in January and if his PSA continues to rise he will be scheduled for radiation. I asked him to call me with questions or concerns.

## 2017-02-16 ENCOUNTER — Ambulatory Visit: Payer: BLUE CROSS/BLUE SHIELD

## 2017-02-16 ENCOUNTER — Encounter: Payer: Self-pay | Admitting: Medical Oncology

## 2017-02-16 ENCOUNTER — Ambulatory Visit
Admission: RE | Admit: 2017-02-16 | Discharge: 2017-02-16 | Disposition: A | Discharge status: Home / Self Care | Payer: BLUE CROSS/BLUE SHIELD | Source: Ambulatory Visit | Attending: Radiation Oncology | Admitting: Radiation Oncology

## 2017-02-16 ENCOUNTER — Ambulatory Visit
Admission: RE | Admit: 2017-02-16 | Discharge: 2017-02-16 | Disposition: A | Discharge status: Home / Self Care | Payer: BLUE CROSS/BLUE SHIELD | Source: Ambulatory Visit | Attending: Urology | Admitting: Urology

## 2017-02-16 DIAGNOSIS — C61 Malignant neoplasm of prostate: Secondary | ICD-10-CM | POA: Diagnosis not present

## 2017-02-24 DIAGNOSIS — C61 Malignant neoplasm of prostate: Secondary | ICD-10-CM | POA: Diagnosis not present

## 2017-02-27 ENCOUNTER — Ambulatory Visit: Payer: BLUE CROSS/BLUE SHIELD

## 2017-02-27 ENCOUNTER — Ambulatory Visit: Payer: BLUE CROSS/BLUE SHIELD | Admitting: Radiation Oncology

## 2017-02-27 DIAGNOSIS — C61 Malignant neoplasm of prostate: Secondary | ICD-10-CM | POA: Diagnosis not present

## 2017-02-28 ENCOUNTER — Ambulatory Visit: Payer: BLUE CROSS/BLUE SHIELD

## 2017-02-28 ENCOUNTER — Ambulatory Visit
Admission: RE | Admit: 2017-02-28 | Discharge: 2017-02-28 | Disposition: A | Discharge status: Home / Self Care | Payer: BLUE CROSS/BLUE SHIELD | Source: Ambulatory Visit | Attending: Radiation Oncology | Admitting: Radiation Oncology

## 2017-02-28 DIAGNOSIS — C61 Malignant neoplasm of prostate: Secondary | ICD-10-CM | POA: Diagnosis not present

## 2017-03-01 ENCOUNTER — Ambulatory Visit
Admission: RE | Admit: 2017-03-01 | Discharge: 2017-03-01 | Disposition: A | Discharge status: Home / Self Care | Payer: BLUE CROSS/BLUE SHIELD | Source: Ambulatory Visit | Attending: Radiation Oncology | Admitting: Radiation Oncology

## 2017-03-01 ENCOUNTER — Ambulatory Visit: Payer: BLUE CROSS/BLUE SHIELD

## 2017-03-01 DIAGNOSIS — C61 Malignant neoplasm of prostate: Secondary | ICD-10-CM | POA: Diagnosis not present

## 2017-03-02 ENCOUNTER — Ambulatory Visit: Payer: BLUE CROSS/BLUE SHIELD

## 2017-03-02 ENCOUNTER — Ambulatory Visit
Admission: RE | Admit: 2017-03-02 | Discharge: 2017-03-02 | Disposition: A | Discharge status: Home / Self Care | Payer: BLUE CROSS/BLUE SHIELD | Source: Ambulatory Visit | Attending: Radiation Oncology | Admitting: Radiation Oncology

## 2017-03-02 DIAGNOSIS — C61 Malignant neoplasm of prostate: Secondary | ICD-10-CM | POA: Diagnosis not present

## 2017-03-03 ENCOUNTER — Ambulatory Visit: Payer: BLUE CROSS/BLUE SHIELD

## 2017-03-03 ENCOUNTER — Ambulatory Visit
Admission: RE | Admit: 2017-03-03 | Discharge: 2017-03-03 | Disposition: A | Discharge status: Home / Self Care | Payer: BLUE CROSS/BLUE SHIELD | Source: Ambulatory Visit | Attending: Radiation Oncology | Admitting: Radiation Oncology

## 2017-03-03 DIAGNOSIS — C61 Malignant neoplasm of prostate: Secondary | ICD-10-CM | POA: Diagnosis not present

## 2017-03-06 ENCOUNTER — Ambulatory Visit
Admission: RE | Admit: 2017-03-06 | Discharge: 2017-03-06 | Disposition: A | Discharge status: Home / Self Care | Payer: BLUE CROSS/BLUE SHIELD | Source: Ambulatory Visit | Attending: Radiation Oncology | Admitting: Radiation Oncology

## 2017-03-06 ENCOUNTER — Ambulatory Visit: Payer: BLUE CROSS/BLUE SHIELD

## 2017-03-06 DIAGNOSIS — C61 Malignant neoplasm of prostate: Secondary | ICD-10-CM | POA: Diagnosis not present

## 2017-03-07 ENCOUNTER — Ambulatory Visit: Payer: BLUE CROSS/BLUE SHIELD

## 2017-03-07 ENCOUNTER — Ambulatory Visit
Admission: RE | Admit: 2017-03-07 | Discharge: 2017-03-07 | Disposition: A | Discharge status: Home / Self Care | Payer: BLUE CROSS/BLUE SHIELD | Source: Ambulatory Visit | Attending: Radiation Oncology | Admitting: Radiation Oncology

## 2017-03-07 DIAGNOSIS — C61 Malignant neoplasm of prostate: Secondary | ICD-10-CM | POA: Diagnosis not present

## 2017-03-08 ENCOUNTER — Ambulatory Visit
Admission: RE | Admit: 2017-03-08 | Discharge: 2017-03-08 | Disposition: A | Discharge status: Home / Self Care | Payer: BLUE CROSS/BLUE SHIELD | Source: Ambulatory Visit | Attending: Radiation Oncology | Admitting: Radiation Oncology

## 2017-03-08 ENCOUNTER — Ambulatory Visit: Payer: BLUE CROSS/BLUE SHIELD

## 2017-03-08 DIAGNOSIS — C61 Malignant neoplasm of prostate: Secondary | ICD-10-CM | POA: Diagnosis not present

## 2017-03-09 ENCOUNTER — Ambulatory Visit: Payer: BLUE CROSS/BLUE SHIELD

## 2017-03-09 ENCOUNTER — Ambulatory Visit
Admission: RE | Admit: 2017-03-09 | Discharge: 2017-03-09 | Disposition: A | Discharge status: Home / Self Care | Payer: BLUE CROSS/BLUE SHIELD | Source: Ambulatory Visit | Attending: Radiation Oncology | Admitting: Radiation Oncology

## 2017-03-09 DIAGNOSIS — C61 Malignant neoplasm of prostate: Secondary | ICD-10-CM | POA: Diagnosis present

## 2017-03-10 ENCOUNTER — Ambulatory Visit: Payer: BLUE CROSS/BLUE SHIELD

## 2017-03-13 ENCOUNTER — Ambulatory Visit
Admission: RE | Admit: 2017-03-13 | Discharge: 2017-03-13 | Disposition: A | Discharge status: Home / Self Care | Payer: BLUE CROSS/BLUE SHIELD | Source: Ambulatory Visit | Attending: Radiation Oncology | Admitting: Radiation Oncology

## 2017-03-13 ENCOUNTER — Ambulatory Visit: Payer: BLUE CROSS/BLUE SHIELD

## 2017-03-13 DIAGNOSIS — C61 Malignant neoplasm of prostate: Secondary | ICD-10-CM | POA: Diagnosis not present

## 2017-03-14 ENCOUNTER — Ambulatory Visit: Payer: BLUE CROSS/BLUE SHIELD

## 2017-03-14 ENCOUNTER — Ambulatory Visit
Admission: RE | Admit: 2017-03-14 | Discharge: 2017-03-14 | Disposition: A | Discharge status: Home / Self Care | Payer: BLUE CROSS/BLUE SHIELD | Source: Ambulatory Visit | Attending: Radiation Oncology | Admitting: Radiation Oncology

## 2017-03-14 DIAGNOSIS — C61 Malignant neoplasm of prostate: Secondary | ICD-10-CM | POA: Diagnosis not present

## 2017-03-15 ENCOUNTER — Ambulatory Visit: Payer: BLUE CROSS/BLUE SHIELD

## 2017-03-15 ENCOUNTER — Ambulatory Visit
Admission: RE | Admit: 2017-03-15 | Discharge: 2017-03-15 | Disposition: A | Discharge status: Home / Self Care | Payer: BLUE CROSS/BLUE SHIELD | Source: Ambulatory Visit | Attending: Radiation Oncology | Admitting: Radiation Oncology

## 2017-03-15 DIAGNOSIS — C61 Malignant neoplasm of prostate: Secondary | ICD-10-CM | POA: Diagnosis not present

## 2017-03-16 ENCOUNTER — Ambulatory Visit: Payer: BLUE CROSS/BLUE SHIELD

## 2017-03-16 ENCOUNTER — Ambulatory Visit
Admission: RE | Admit: 2017-03-16 | Discharge: 2017-03-16 | Disposition: A | Discharge status: Home / Self Care | Payer: BLUE CROSS/BLUE SHIELD | Source: Ambulatory Visit | Attending: Radiation Oncology | Admitting: Radiation Oncology

## 2017-03-16 DIAGNOSIS — C61 Malignant neoplasm of prostate: Secondary | ICD-10-CM | POA: Diagnosis not present

## 2017-03-17 ENCOUNTER — Ambulatory Visit
Admission: RE | Admit: 2017-03-17 | Discharge: 2017-03-17 | Disposition: A | Discharge status: Home / Self Care | Payer: BLUE CROSS/BLUE SHIELD | Source: Ambulatory Visit | Attending: Radiation Oncology | Admitting: Radiation Oncology

## 2017-03-17 ENCOUNTER — Ambulatory Visit: Payer: BLUE CROSS/BLUE SHIELD

## 2017-03-17 DIAGNOSIS — C61 Malignant neoplasm of prostate: Secondary | ICD-10-CM | POA: Diagnosis not present

## 2017-03-20 ENCOUNTER — Ambulatory Visit
Admission: RE | Admit: 2017-03-20 | Discharge: 2017-03-20 | Disposition: A | Discharge status: Home / Self Care | Payer: BLUE CROSS/BLUE SHIELD | Source: Ambulatory Visit | Attending: Radiation Oncology | Admitting: Radiation Oncology

## 2017-03-20 ENCOUNTER — Ambulatory Visit: Payer: BLUE CROSS/BLUE SHIELD

## 2017-03-20 DIAGNOSIS — C61 Malignant neoplasm of prostate: Secondary | ICD-10-CM | POA: Diagnosis not present

## 2017-03-21 ENCOUNTER — Ambulatory Visit
Admission: RE | Admit: 2017-03-21 | Discharge: 2017-03-21 | Disposition: A | Discharge status: Home / Self Care | Payer: BLUE CROSS/BLUE SHIELD | Source: Ambulatory Visit | Attending: Radiation Oncology | Admitting: Radiation Oncology

## 2017-03-21 ENCOUNTER — Ambulatory Visit: Payer: BLUE CROSS/BLUE SHIELD

## 2017-03-21 DIAGNOSIS — C61 Malignant neoplasm of prostate: Secondary | ICD-10-CM | POA: Diagnosis not present

## 2017-03-22 ENCOUNTER — Ambulatory Visit: Payer: BLUE CROSS/BLUE SHIELD

## 2017-03-22 ENCOUNTER — Ambulatory Visit
Admission: RE | Admit: 2017-03-22 | Discharge: 2017-03-22 | Disposition: A | Discharge status: Home / Self Care | Payer: BLUE CROSS/BLUE SHIELD | Source: Ambulatory Visit | Attending: Radiation Oncology | Admitting: Radiation Oncology

## 2017-03-22 DIAGNOSIS — C61 Malignant neoplasm of prostate: Secondary | ICD-10-CM | POA: Diagnosis not present

## 2017-03-23 ENCOUNTER — Ambulatory Visit: Payer: BLUE CROSS/BLUE SHIELD

## 2017-03-23 ENCOUNTER — Ambulatory Visit
Admission: RE | Admit: 2017-03-23 | Discharge: 2017-03-23 | Disposition: A | Discharge status: Home / Self Care | Payer: BLUE CROSS/BLUE SHIELD | Source: Ambulatory Visit | Attending: Radiation Oncology | Admitting: Radiation Oncology

## 2017-03-23 DIAGNOSIS — C61 Malignant neoplasm of prostate: Secondary | ICD-10-CM | POA: Diagnosis not present

## 2017-03-24 ENCOUNTER — Ambulatory Visit: Payer: BLUE CROSS/BLUE SHIELD

## 2017-03-24 ENCOUNTER — Ambulatory Visit
Admission: RE | Admit: 2017-03-24 | Discharge: 2017-03-24 | Disposition: A | Discharge status: Home / Self Care | Payer: BLUE CROSS/BLUE SHIELD | Source: Ambulatory Visit | Attending: Radiation Oncology | Admitting: Radiation Oncology

## 2017-03-24 DIAGNOSIS — C61 Malignant neoplasm of prostate: Secondary | ICD-10-CM | POA: Insufficient documentation

## 2017-03-24 DIAGNOSIS — Z510 Encounter for antineoplastic radiation therapy: Secondary | ICD-10-CM | POA: Insufficient documentation

## 2017-03-27 ENCOUNTER — Ambulatory Visit: Payer: BLUE CROSS/BLUE SHIELD

## 2017-03-27 ENCOUNTER — Ambulatory Visit
Admission: RE | Admit: 2017-03-27 | Discharge: 2017-03-27 | Disposition: A | Discharge status: Home / Self Care | Payer: BLUE CROSS/BLUE SHIELD | Source: Ambulatory Visit | Attending: Radiation Oncology | Admitting: Radiation Oncology

## 2017-03-27 DIAGNOSIS — C61 Malignant neoplasm of prostate: Secondary | ICD-10-CM | POA: Diagnosis not present

## 2017-03-28 ENCOUNTER — Ambulatory Visit: Payer: BLUE CROSS/BLUE SHIELD

## 2017-03-28 ENCOUNTER — Ambulatory Visit
Admission: RE | Admit: 2017-03-28 | Discharge: 2017-03-28 | Disposition: A | Discharge status: Home / Self Care | Payer: BLUE CROSS/BLUE SHIELD | Source: Ambulatory Visit | Attending: Radiation Oncology | Admitting: Radiation Oncology

## 2017-03-28 DIAGNOSIS — C61 Malignant neoplasm of prostate: Secondary | ICD-10-CM | POA: Diagnosis not present

## 2017-03-29 ENCOUNTER — Ambulatory Visit: Payer: BLUE CROSS/BLUE SHIELD

## 2017-03-29 ENCOUNTER — Ambulatory Visit
Admission: RE | Admit: 2017-03-29 | Discharge: 2017-03-29 | Disposition: A | Discharge status: Home / Self Care | Payer: BLUE CROSS/BLUE SHIELD | Source: Ambulatory Visit | Attending: Radiation Oncology | Admitting: Radiation Oncology

## 2017-03-29 DIAGNOSIS — C61 Malignant neoplasm of prostate: Secondary | ICD-10-CM | POA: Diagnosis not present

## 2017-03-30 ENCOUNTER — Ambulatory Visit
Admission: RE | Admit: 2017-03-30 | Discharge: 2017-03-30 | Disposition: A | Discharge status: Home / Self Care | Payer: BLUE CROSS/BLUE SHIELD | Source: Ambulatory Visit | Attending: Radiation Oncology | Admitting: Radiation Oncology

## 2017-03-30 ENCOUNTER — Ambulatory Visit: Payer: BLUE CROSS/BLUE SHIELD

## 2017-03-30 DIAGNOSIS — C61 Malignant neoplasm of prostate: Secondary | ICD-10-CM | POA: Diagnosis not present

## 2017-03-31 ENCOUNTER — Ambulatory Visit
Admission: RE | Admit: 2017-03-31 | Discharge: 2017-03-31 | Disposition: A | Discharge status: Home / Self Care | Payer: BLUE CROSS/BLUE SHIELD | Source: Ambulatory Visit | Attending: Radiation Oncology | Admitting: Radiation Oncology

## 2017-03-31 ENCOUNTER — Ambulatory Visit: Payer: BLUE CROSS/BLUE SHIELD

## 2017-03-31 DIAGNOSIS — C61 Malignant neoplasm of prostate: Secondary | ICD-10-CM | POA: Diagnosis not present

## 2017-04-03 ENCOUNTER — Ambulatory Visit
Admission: RE | Admit: 2017-04-03 | Discharge: 2017-04-03 | Disposition: A | Discharge status: Home / Self Care | Payer: BLUE CROSS/BLUE SHIELD | Source: Ambulatory Visit | Attending: Radiation Oncology | Admitting: Radiation Oncology

## 2017-04-03 ENCOUNTER — Ambulatory Visit: Payer: BLUE CROSS/BLUE SHIELD

## 2017-04-03 DIAGNOSIS — C61 Malignant neoplasm of prostate: Secondary | ICD-10-CM | POA: Diagnosis not present

## 2017-04-04 ENCOUNTER — Ambulatory Visit: Payer: BLUE CROSS/BLUE SHIELD

## 2017-04-04 ENCOUNTER — Ambulatory Visit
Admission: RE | Admit: 2017-04-04 | Discharge: 2017-04-04 | Disposition: A | Discharge status: Home / Self Care | Payer: BLUE CROSS/BLUE SHIELD | Source: Ambulatory Visit | Attending: Radiation Oncology | Admitting: Radiation Oncology

## 2017-04-04 DIAGNOSIS — C61 Malignant neoplasm of prostate: Secondary | ICD-10-CM | POA: Diagnosis not present

## 2017-04-05 ENCOUNTER — Ambulatory Visit
Admission: RE | Admit: 2017-04-05 | Discharge: 2017-04-05 | Disposition: A | Discharge status: Home / Self Care | Payer: BLUE CROSS/BLUE SHIELD | Source: Ambulatory Visit | Attending: Radiation Oncology | Admitting: Radiation Oncology

## 2017-04-05 ENCOUNTER — Ambulatory Visit: Payer: BLUE CROSS/BLUE SHIELD

## 2017-04-05 DIAGNOSIS — C61 Malignant neoplasm of prostate: Secondary | ICD-10-CM | POA: Diagnosis not present

## 2017-04-06 ENCOUNTER — Ambulatory Visit: Payer: BLUE CROSS/BLUE SHIELD

## 2017-04-06 ENCOUNTER — Ambulatory Visit
Admission: RE | Admit: 2017-04-06 | Discharge: 2017-04-06 | Disposition: A | Discharge status: Home / Self Care | Payer: BLUE CROSS/BLUE SHIELD | Source: Ambulatory Visit | Attending: Radiation Oncology | Admitting: Radiation Oncology

## 2017-04-06 DIAGNOSIS — C61 Malignant neoplasm of prostate: Secondary | ICD-10-CM | POA: Diagnosis not present

## 2017-04-07 ENCOUNTER — Ambulatory Visit
Admission: RE | Admit: 2017-04-07 | Discharge: 2017-04-07 | Disposition: A | Discharge status: Home / Self Care | Payer: BLUE CROSS/BLUE SHIELD | Source: Ambulatory Visit | Attending: Radiation Oncology | Admitting: Radiation Oncology

## 2017-04-07 ENCOUNTER — Ambulatory Visit: Payer: BLUE CROSS/BLUE SHIELD

## 2017-04-07 DIAGNOSIS — C61 Malignant neoplasm of prostate: Secondary | ICD-10-CM | POA: Diagnosis not present

## 2017-04-10 ENCOUNTER — Ambulatory Visit: Payer: BLUE CROSS/BLUE SHIELD

## 2017-04-10 ENCOUNTER — Ambulatory Visit
Admission: RE | Admit: 2017-04-10 | Discharge: 2017-04-10 | Disposition: A | Discharge status: Home / Self Care | Payer: BLUE CROSS/BLUE SHIELD | Source: Ambulatory Visit | Attending: Radiation Oncology | Admitting: Radiation Oncology

## 2017-04-10 DIAGNOSIS — C61 Malignant neoplasm of prostate: Secondary | ICD-10-CM | POA: Diagnosis not present

## 2017-04-11 ENCOUNTER — Ambulatory Visit
Admission: RE | Admit: 2017-04-11 | Discharge: 2017-04-11 | Disposition: A | Discharge status: Home / Self Care | Payer: BLUE CROSS/BLUE SHIELD | Source: Ambulatory Visit | Attending: Radiation Oncology | Admitting: Radiation Oncology

## 2017-04-11 ENCOUNTER — Ambulatory Visit: Payer: BLUE CROSS/BLUE SHIELD

## 2017-04-11 DIAGNOSIS — C61 Malignant neoplasm of prostate: Secondary | ICD-10-CM | POA: Diagnosis not present

## 2017-04-12 ENCOUNTER — Ambulatory Visit
Admission: RE | Admit: 2017-04-12 | Discharge: 2017-04-12 | Disposition: A | Discharge status: Home / Self Care | Payer: BLUE CROSS/BLUE SHIELD | Source: Ambulatory Visit | Attending: Radiation Oncology | Admitting: Radiation Oncology

## 2017-04-12 ENCOUNTER — Ambulatory Visit: Payer: BLUE CROSS/BLUE SHIELD

## 2017-04-12 DIAGNOSIS — C61 Malignant neoplasm of prostate: Secondary | ICD-10-CM | POA: Diagnosis not present

## 2017-04-13 ENCOUNTER — Ambulatory Visit: Payer: BLUE CROSS/BLUE SHIELD

## 2017-04-13 ENCOUNTER — Ambulatory Visit
Admission: RE | Admit: 2017-04-13 | Discharge: 2017-04-13 | Disposition: A | Discharge status: Home / Self Care | Payer: BLUE CROSS/BLUE SHIELD | Source: Ambulatory Visit | Attending: Radiation Oncology | Admitting: Radiation Oncology

## 2017-04-13 DIAGNOSIS — C61 Malignant neoplasm of prostate: Secondary | ICD-10-CM | POA: Diagnosis not present

## 2017-04-14 ENCOUNTER — Ambulatory Visit
Admission: RE | Admit: 2017-04-14 | Discharge: 2017-04-14 | Disposition: A | Discharge status: Home / Self Care | Payer: BLUE CROSS/BLUE SHIELD | Source: Ambulatory Visit | Attending: Radiation Oncology | Admitting: Radiation Oncology

## 2017-04-14 ENCOUNTER — Ambulatory Visit: Payer: BLUE CROSS/BLUE SHIELD

## 2017-04-14 DIAGNOSIS — C61 Malignant neoplasm of prostate: Secondary | ICD-10-CM | POA: Diagnosis not present

## 2017-04-17 ENCOUNTER — Ambulatory Visit
Admission: RE | Admit: 2017-04-17 | Discharge: 2017-04-17 | Disposition: A | Discharge status: Home / Self Care | Payer: BLUE CROSS/BLUE SHIELD | Source: Ambulatory Visit | Attending: Radiation Oncology | Admitting: Radiation Oncology

## 2017-04-17 ENCOUNTER — Ambulatory Visit: Payer: BLUE CROSS/BLUE SHIELD

## 2017-04-17 DIAGNOSIS — C61 Malignant neoplasm of prostate: Secondary | ICD-10-CM | POA: Diagnosis not present

## 2017-04-18 ENCOUNTER — Ambulatory Visit
Admission: RE | Admit: 2017-04-18 | Discharge: 2017-04-18 | Disposition: A | Discharge status: Home / Self Care | Payer: BLUE CROSS/BLUE SHIELD | Source: Ambulatory Visit | Attending: Radiation Oncology | Admitting: Radiation Oncology

## 2017-04-18 ENCOUNTER — Ambulatory Visit: Payer: BLUE CROSS/BLUE SHIELD

## 2017-04-18 DIAGNOSIS — C61 Malignant neoplasm of prostate: Secondary | ICD-10-CM | POA: Diagnosis not present

## 2017-04-19 ENCOUNTER — Ambulatory Visit
Admission: RE | Admit: 2017-04-19 | Discharge: 2017-04-19 | Disposition: A | Discharge status: Home / Self Care | Payer: BLUE CROSS/BLUE SHIELD | Source: Ambulatory Visit | Attending: Radiation Oncology | Admitting: Radiation Oncology

## 2017-04-19 ENCOUNTER — Ambulatory Visit: Payer: BLUE CROSS/BLUE SHIELD

## 2017-04-19 DIAGNOSIS — C61 Malignant neoplasm of prostate: Secondary | ICD-10-CM | POA: Diagnosis not present

## 2017-04-20 ENCOUNTER — Encounter: Payer: Self-pay | Admitting: Radiation Oncology

## 2017-04-20 ENCOUNTER — Ambulatory Visit: Payer: BLUE CROSS/BLUE SHIELD

## 2017-04-20 ENCOUNTER — Ambulatory Visit
Admission: RE | Admit: 2017-04-20 | Discharge: 2017-04-20 | Disposition: A | Discharge status: Home / Self Care | Payer: BLUE CROSS/BLUE SHIELD | Source: Ambulatory Visit | Attending: Radiation Oncology | Admitting: Radiation Oncology

## 2017-04-20 DIAGNOSIS — C61 Malignant neoplasm of prostate: Secondary | ICD-10-CM | POA: Diagnosis not present

## 2017-04-20 NOTE — Progress Notes (Signed)
  Radiation Oncology         [phone]) [phone] ________________________________  Name: Francisco Freeman MRN: [id number]  Date: 04/20/2017  DOB: 03-08-1955  End of Treatment Note  Diagnosis: 62 y.o. gentleman with rising PSA of 0.18, post prostatectomy for adenocarcinoma of the prostate with a Gleason's score of 3+4 with a single focal positive margin     Indication for treatment: Curative       Radiation treatment dates:   02/27/17-04/20/17  Site/dose:   Prostate bed/ 68.4 Gy in 38 fractions  Beams/energy: IMRT/ 6X  Narrative: The patient tolerated radiation treatment relatively well. During treatment, he complained of dysuria, nocturia x 1-2, and mild fatigue.  He reported urgency without incontinence and denied hesitancy or straining to void.  He was able to empty his bladder completely with voiding and maintained a strong, steady stream.  He did experience loose bowel movements 2 times per day but denies abdominal pain or outright diarrhea.  Plan: The patient has completed radiation treatment. The patient will return to radiation oncology clinic for routine followup in one month. I advised him to call or return sooner if he has any questions or concerns related to his recovery or treatment. ________________________________  Sheral Apley. Tammi Klippel, M.D.  This document serves as a record of services personally performed by Tyler Pita, MD. It was created on his behalf by Bethann Humble, a trained medical scribe. The creation of this record is based on the scribe's personal observations and the provider's statements to them. This document has been checked and approved by the attending provider.

## 2017-04-21 ENCOUNTER — Ambulatory Visit: Payer: BLUE CROSS/BLUE SHIELD

## 2017-05-10 ENCOUNTER — Ambulatory Visit: Payer: Self-pay | Admitting: Urology

## 2017-05-24 ENCOUNTER — Encounter: Payer: Self-pay | Admitting: Urology

## 2017-05-24 ENCOUNTER — Ambulatory Visit
Admission: RE | Admit: 2017-05-24 | Discharge: 2017-05-24 | Disposition: A | Discharge status: Home / Self Care | Payer: BLUE CROSS/BLUE SHIELD | Source: Ambulatory Visit | Attending: Urology | Admitting: Urology

## 2017-05-24 ENCOUNTER — Other Ambulatory Visit: Payer: Self-pay

## 2017-05-24 VITALS — BP 147/77 | HR 51 | Temp 97.9°F | Resp 20 | Ht 70.5 in | Wt 312.0 lb

## 2017-05-24 DIAGNOSIS — C61 Malignant neoplasm of prostate: Secondary | ICD-10-CM | POA: Diagnosis not present

## 2017-05-24 DIAGNOSIS — Z79899 Other long term (current) drug therapy: Secondary | ICD-10-CM | POA: Diagnosis not present

## 2017-05-24 DIAGNOSIS — Z923 Personal history of irradiation: Secondary | ICD-10-CM | POA: Diagnosis not present

## 2017-05-24 NOTE — Progress Notes (Signed)
Radiation Oncology         [phone] ________________________________  Name: Francisco Freeman MRN: [id number]  Date: 05/24/2017  DOB: 08/13/1955  Post Treatment Note  CC: Francisco Hector, MD  Francisco Bring, MD  Diagnosis:   62 y.o.gentleman with rising PSA of 0.18, postprostatectomyforadenocarcinoma of the prostate with a Gleason's score of 3+4and a single focal positive margin  Interval Since Last Radiation:  5 weeks  02/27/17-04/20/17:   Prostate bed/ 68.4 Gy in 38 fractions  Narrative:  The patient returns today for routine follow-up.  He tolerated radiation treatment relatively well. During treatment, he complained of dysuria, nocturia x 1-2, and mild fatigue.  He reported urgency without incontinence and denied hesitancy or straining to void.  He was able to empty his bladder completely with voiding and maintained a strong, steady stream.  He did experience loose bowel movements 2 times per day but denies abdominal pain or outright diarrhea.                           On review of systems, the patient states that he is doing well overall.  He has noted gradual improvement in his lower urinary tract symptoms, current IPSS score is 4 indicating mild symptoms only.  He has mild residual urgency but denies excessive daytime frequency or nocturia.  He denies dysuria, gross hematuria, straining to void or feelings of incomplete emptying.  He reports a healthy appetite and is maintaining his weight.  He denies abdominal pain, nausea, vomiting or diarrhea.  Overall, he is pleased with his progress at this point.  ALLERGIES:  is allergic to amlodipine besylate; bisoprolol-hydrochlorothiazide; hydrochlorothiazide; irbesartan; lisinopril; and lisinopril-hydrochlorothiazide.  Meds: Current Outpatient Medications  Medication Sig Dispense Refill  . Ascorbic Acid 500 MG CHEW Chew 500 mg by mouth daily.    Marland Kitchen atenolol (TENORMIN) 25 MG tablet Take 25 mg by mouth daily.    .  Cholecalciferol (D3 MAXIMUM STRENGTH) 5000 units capsule Take 5,000 Units by mouth daily.    . furosemide (LASIX) 40 MG tablet Take 40 mg by mouth daily as needed.    . hydrALAZINE (APRESOLINE) 100 MG tablet Take 100 mg by mouth 2 (two) times daily.    . potassium chloride (K-DUR,KLOR-CON) 10 MEQ tablet Take 10 mEq by mouth daily.    Marland Kitchen losartan (COZAAR) 100 MG tablet Take 1 tablet (100 mg total) by mouth daily. 30 tablet 11   No current facility-administered medications for this encounter.     Physical Findings:  height is 5' 10.5" (1.791 m) and weight is 312 lb (141.5 kg) (abnormal). His oral temperature is 97.9 F (36.6 C). His blood pressure is 147/77 (abnormal) and his pulse is 51 (abnormal). His respiration is 20 and oxygen saturation is 95%.  Pain Assessment Pain Score: 0-No pain/10 In general this is a well appearing Caucasian male in no acute distress.  He's alert and oriented x4 and appropriate throughout the examination. Cardiopulmonary assessment is negative for acute distress and he exhibits normal effort.   Lab Findings: Lab Results  Component Value Date   WBC 11.7 (H) 10/03/2012   HGB 18.3 (H) 10/03/2012   HCT 53.2 (H) 10/03/2012   MCV 89 10/03/2012   PLT 181 10/03/2012     Radiographic Findings: No results found.  Impression/Plan: 1. 62 y.o.gentleman with rising PSA of 0.18, postprostatectomyforadenocarcinoma of the prostate with a Gleason's score of 3+4and a single focal positive margin.  He will continue to follow up with urology for ongoing PSA determinations and has an appointment scheduled with Dr. Alinda Freeman on 06/28/2017. He understands what to expect with regards to PSA monitoring going forward. I will look forward to following his response to treatment via correspondence with urology, and would be happy to continue to participate in his care if clinically indicated. I talked to the patient about what to expect in the future, including his risk for erectile  dysfunction and rectal bleeding. I encouraged him to call or return to the office if he has any questions regarding his previous radiation or possible radiation side effects. He was comfortable with this plan and will follow up as needed.    Nicholos Johns, PA-C

## 2018-02-21 ENCOUNTER — Ambulatory Visit: Payer: PRIVATE HEALTH INSURANCE | Admitting: Urology

## 2018-02-21 ENCOUNTER — Encounter: Payer: Self-pay | Admitting: Urology

## 2018-02-21 VITALS — BP 169/96 | HR 43 | Ht 70.5 in | Wt 300.0 lb

## 2018-02-21 DIAGNOSIS — C61 Malignant neoplasm of prostate: Secondary | ICD-10-CM

## 2018-02-21 NOTE — Progress Notes (Signed)
02/21/2018 1:46 PM   Francisco Freeman Jan 19, 1956 [id number]  Referring provider: Adin Hector, MD [facility] [place], [place] [address]  Chief Complaint  Patient presents with  . Prostate Cancer   Urologic history: 1. pT3a N0 Mx adenocarcinoma prostate  -RALP 01/2006; Dr. Alinda Money  -Salvage radiation February 2019; PSA 0.26 pretreatment  2.  Microhematuria  -June 2019  -CT punctate nonobstructing left renal calculus, simple renal cysts  -Cystoscopy radiation changes of bulbar urethra otherwise negative   HPI: 63 year old male presents to establish local urologic care.  He last saw Dr. Alinda Money in June 2019.  His last PSA May 2019 was 0.22.  He denies bothersome lower urinary tract symptoms.  Denies dysuria, gross hematuria or flank/abdominal/pelvic/scrotal pain.   PMH: Past Medical History:  Diagnosis Date  . Arthritis    Gout  . Edema   . Gout   . Gout   . Hypertension   . Prostate cancer ([place])   . Renal stones     Surgical History: Past Surgical History:  Procedure Laterality Date  . COLONOSCOPY    . COLONOSCOPY N/A 12/28/2016   Procedure: COLONOSCOPY;  Surgeon: Manya Silvas, MD;  Location: [facility] ENDOSCOPY;  Service: Endoscopy;  Laterality: N/A;  . PROSTATE SURGERY     robot assisted prostatectomy  . PROSTATECTOMY  01/24/2006    Home Medications:  Allergies as of 02/21/2018      Reactions   Amlodipine Besylate    REACTION: felt bad   Bisoprolol-hydrochlorothiazide    REACTION: Syncope   Hydrochlorothiazide Other (See Comments)   gout   Irbesartan    REACTION: BP elevated   Lisinopril Cough   Lisinopril-hydrochlorothiazide    REACTION: Cough      Medication List       Accurate as of February 21, 2018  1:46 PM. Always use your most recent med list.        Ascorbic Acid 500 MG Chew Chew 500 mg by mouth daily.   atenolol 25 MG tablet Commonly known as:  TENORMIN Take 25 mg by mouth daily.   D3  MAXIMUM STRENGTH 125 MCG (5000 UT) capsule Generic drug:  Cholecalciferol Take 5,000 Units by mouth daily.   furosemide 40 MG tablet Commonly known as:  LASIX Take 40 mg by mouth daily as needed.   hydrALAZINE 100 MG tablet Commonly known as:  APRESOLINE Take 100 mg by mouth 2 (two) times daily.   losartan 100 MG tablet Commonly known as:  COZAAR Take 1 tablet (100 mg total) by mouth daily.   potassium chloride 10 MEQ tablet Commonly known as:  K-DUR,KLOR-CON Take 10 mEq by mouth daily.       Allergies:  Allergies  Allergen Reactions  . Amlodipine Besylate     REACTION: felt bad  . Bisoprolol-Hydrochlorothiazide     REACTION: Syncope  . Hydrochlorothiazide Other (See Comments)    gout  . Irbesartan     REACTION: BP elevated  . Lisinopril Cough  . Lisinopril-Hydrochlorothiazide     REACTION: Cough    Family History: Family History  Problem Relation Age of Onset  . Cancer Mother 85       breast  . Cancer Father        colon  . Hypertension Father     Social History:  reports that he has never smoked. He has never used smokeless tobacco. He reports current alcohol use of about 1.0 standard drinks of alcohol per week. He  reports that he does not use drugs.  ROS: UROLOGY Frequent Urination?: No Hard to postpone urination?: No Burning/pain with urination?: No Get up at night to urinate?: No Leakage of urine?: No Urine stream starts and stops?: No Trouble starting stream?: No Do you have to strain to urinate?: No Blood in urine?: No Urinary tract infection?: No Sexually transmitted disease?: No Injury to kidneys or bladder?: No Painful intercourse?: No Weak stream?: No Erection problems?: No Penile pain?: No  Gastrointestinal Nausea?: No Vomiting?: No Indigestion/heartburn?: No Diarrhea?: No Constipation?: No  Constitutional Fever: No Night sweats?: No Weight loss?: No Fatigue?: No  Skin Skin rash/lesions?: No Itching?: No  Eyes Blurred  vision?: No Double vision?: No  Ears/Nose/Throat Sore throat?: No Sinus problems?: No  Hematologic/Lymphatic Swollen glands?: No Easy bruising?: No  Cardiovascular Leg swelling?: No Chest pain?: No  Respiratory Cough?: No Shortness of breath?: No  Endocrine Excessive thirst?: No  Musculoskeletal Back pain?: No Joint pain?: No  Neurological Headaches?: No Dizziness?: No  Psychologic Depression?: No Anxiety?: No  Physical Exam: BP (!) 169/96   Pulse (!) 43   Ht 5' 10.5" (1.791 m)   Wt 300 lb (136.1 kg)   BMI 42.44 kg/m   Constitutional:  Alert and oriented, No acute distress. HEENT: [place] AT, moist mucus membranes.  Trachea midline, no masses. Cardiovascular: No clubbing, cyanosis, or edema. Respiratory: Normal respiratory effort, no increased work of breathing. Skin: No rashes, bruises or suspicious lesions. Neurologic: Grossly intact, no focal deficits, moving all 4 extremities. Psychiatric: Normal mood and affect.   Assessment & Plan:    1. Prostate cancer [facility]) Completed radiation therapy approximately 1 year ago for biochemical recurrence.  He states he is scheduled to have blood drawn with his PCP at the end of this week and will asked that a PSA be added on.  If stable recommend a follow-up PSA in 6 months and office visit in 1 year.   Abbie Sons, [place] [address], [place], [place] [address] [phone]

## 2018-02-23 ENCOUNTER — Encounter: Payer: Self-pay | Admitting: Urology

## 2018-08-22 ENCOUNTER — Other Ambulatory Visit: Payer: Self-pay

## 2018-08-22 DIAGNOSIS — C61 Malignant neoplasm of prostate: Secondary | ICD-10-CM

## 2018-08-23 ENCOUNTER — Other Ambulatory Visit: Payer: Self-pay

## 2018-08-23 ENCOUNTER — Other Ambulatory Visit: Payer: PRIVATE HEALTH INSURANCE

## 2018-08-23 DIAGNOSIS — C61 Malignant neoplasm of prostate: Secondary | ICD-10-CM

## 2018-08-24 LAB — PSA: Prostate Specific Ag, Serum: 0.4 ng/mL (ref 0.0–4.0)

## 2018-08-27 ENCOUNTER — Telehealth: Payer: Self-pay | Admitting: *Deleted

## 2018-08-27 NOTE — Telephone Encounter (Addendum)
Patient informed, verbalized understanding.    ----- Message from Abbie Sons, MD sent at 08/26/2018 12:07 PM EDT ----- PSA was 0.4

## 2019-02-22 ENCOUNTER — Ambulatory Visit: Payer: PRIVATE HEALTH INSURANCE | Admitting: Urology

## 2020-11-17 ENCOUNTER — Other Ambulatory Visit (HOSPITAL_COMMUNITY): Payer: Self-pay | Admitting: Urology

## 2020-11-17 DIAGNOSIS — C61 Malignant neoplasm of prostate: Secondary | ICD-10-CM

## 2020-11-25 ENCOUNTER — Ambulatory Visit (HOSPITAL_COMMUNITY): Admission: RE | Admit: 2020-11-25 | Payer: 59 | Source: Ambulatory Visit

## 2020-12-08 ENCOUNTER — Ambulatory Visit (HOSPITAL_COMMUNITY)
Admission: RE | Admit: 2020-12-08 | Discharge: 2020-12-08 | Disposition: A | Discharge status: Home / Self Care | Payer: 59 | Source: Ambulatory Visit | Attending: Urology | Admitting: Urology

## 2020-12-08 ENCOUNTER — Other Ambulatory Visit: Payer: Self-pay

## 2020-12-08 DIAGNOSIS — C61 Malignant neoplasm of prostate: Secondary | ICD-10-CM | POA: Diagnosis present

## 2020-12-08 MED ORDER — PIFLIFOLASTAT F 18 (PYLARIFY) INJECTION
9.0000 | Freq: Once | INTRAVENOUS | Status: AC
  Administered 2020-12-08: 9.03 via INTRAVENOUS

## 2021-02-10 DIAGNOSIS — D696 Thrombocytopenia, unspecified: Secondary | ICD-10-CM | POA: Diagnosis not present

## 2021-02-10 DIAGNOSIS — I10 Essential (primary) hypertension: Secondary | ICD-10-CM | POA: Diagnosis not present

## 2021-02-17 DIAGNOSIS — E538 Deficiency of other specified B group vitamins: Secondary | ICD-10-CM | POA: Diagnosis not present

## 2021-02-17 DIAGNOSIS — M1A09X0 Idiopathic chronic gout, multiple sites, without tophus (tophi): Secondary | ICD-10-CM | POA: Diagnosis not present

## 2021-02-17 DIAGNOSIS — G4733 Obstructive sleep apnea (adult) (pediatric): Secondary | ICD-10-CM | POA: Diagnosis not present

## 2021-02-17 DIAGNOSIS — C61 Malignant neoplasm of prostate: Secondary | ICD-10-CM | POA: Diagnosis not present

## 2021-02-17 DIAGNOSIS — I10 Essential (primary) hypertension: Secondary | ICD-10-CM | POA: Diagnosis not present

## 2021-04-29 ENCOUNTER — Ambulatory Visit: Payer: No Typology Code available for payment source | Admitting: Urology

## 2021-07-29 ENCOUNTER — Ambulatory Visit (INDEPENDENT_AMBULATORY_CARE_PROVIDER_SITE_OTHER): Payer: Medicare Other | Admitting: Urology

## 2021-07-29 ENCOUNTER — Encounter: Payer: Self-pay | Admitting: Urology

## 2021-07-29 VITALS — BP 129/80 | HR 41 | Ht 70.5 in | Wt 276.0 lb

## 2021-07-29 DIAGNOSIS — Z8546 Personal history of malignant neoplasm of prostate: Secondary | ICD-10-CM

## 2021-07-29 NOTE — Progress Notes (Signed)
07/29/21 10:08 AM   Francisco Freeman 03/28/1955 [id number]  CC: Prostate cancer, ED, gross hematuria  HPI: 66 year old male with complex medical history.  He was treated for prostate cancer with radical prostatectomy by Dr. Alinda Money in [place] in 2008(3+4=7 prostate adenocarcinoma, extraprostatic extension present, focally positive margin at the left posterior, negative lymph node) and reportedly had a biochemical recurrence of 0.29 in 2019 that was treated with radiation.  He then had significant persistent rise in his PSA to 2.9, and PSMA PET scan in December 13, 2020 showed no evidence of recurrence.  PSA actually decreased to 2.25 in January 2023, and the patient and Dr. Alinda Money opted to continue close PSA monitoring.  He continues to deny any symptoms of bone pain.  He has had some weight loss but this is intentional with dieting and exercise.  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  He deferred CT imaging at that time after having a relatively normal CT in 2019.  He denies any further episodes of gross hematuria and really denies any urinary symptoms today aside from some frequency when he takes his furosemide.  Denies any recurrence of gross hematuria.  He has ED over the last few years.  He previously tried Cialis and Viagra with minimal to no improvement, and is not interested in trying other medications at this time, but may reconsider in the future.   PMH: Past Medical History:  Diagnosis Date   Arthritis    Gout   Edema    Gout    Gout    Hypertension    Prostate cancer ([place])    Renal stones     Surgical History: Past Surgical History:  Procedure Laterality Date   COLONOSCOPY     COLONOSCOPY N/A 12/28/2016   Procedure: COLONOSCOPY;  Surgeon: Manya Silvas, MD;  Location: [facility] END[facility];  Service: Endoscopy;  Laterality: N/A;   PROSTATE SURGERY     robot  assisted prostatectomy   PROSTATECTOMY  01/24/2006      Family History: Family History  Problem Relation Age of Onset   Cancer Mother 68       breast   Cancer Father        colon   Hypertension Father     Social History:  reports that he has never smoked. He has never been exposed to tobacco smoke. He has never used smokeless tobacco. He reports current alcohol use of about 1.0 standard drink of alcohol per week. He reports that he does not use drugs.  Physical Exam: BP 129/80   Pulse (!) 41   Ht 5' 10.5" (1.791 m)   Wt 276 lb (125.2 kg)   BMI 39.04 kg/m    Constitutional:  Alert and oriented, No acute distress. Cardiovascular: No clubbing, cyanosis, or edema. Respiratory: Normal respiratory effort, no increased work of breathing. GI: Abdomen is soft, nontender, nondistended, no abdominal masses  Laboratory Data: Reviewed, see HPI  Pertinent Imaging: I have personally viewed and interpreted the PSMA PET scan from November 2022 showing no evidence of recurrence.  Assessment & Plan:   66 year old male originally diagnosed with prostate cancer in 2008 and treated with robotic radical prostatectomy and bilateral pelvic lymph node dissection(3+4=7 prostate adenocarcinoma, extraprostatic extension present, focally positive margin at the left posterior, negative lymph nodes), treated with radiation in 2019 for biochemical recurrence of 0.29, and persistently rising PSA since that time.  PSA was 2.9 in November 2022, but stabilized at 2.25 in January 2023.  He is asymptomatic, and PSMA PET scan was negative in November 2022.  He has been followed by Dr. Alinda Money in [place], but is transferring his care locally to [facility] based on transportation issues.  He also has persistent erectile dysfunction, but is not interested in medications at this time.  Mild urinary symptoms exacerbated by Lasix, and behavioral strategies discussed.  Regarding his hematuria, cystoscopy in November  2022 showed changes consistent with radiation cystitis but no other abnormalities and he deferred CT.  Denies any gross hematuria since that time, no microscopic hematuria on most recent UA from January 2023.  We reviewed the importance of continuing to monitor the PSA, and we reviewed the AUA guidelines regarding the advanced prostate cancer treatment algorithms and treatment options.  He is in agreement to continue observation at this time, but understands need for PSMA PET scan or additional treatments potentially in the future pending the PSA trend.  -PSA today, call with results.  If stable can continue q70-monthPSA  -If PSA rising significantly >4 anticipate PSMA PET scan to evaluate for site of metastatic disease   I spent 60 total minutes on the day of the encounter including pre-visit review of the medical record, face-to-face time with the patient, and post visit ordering of labs/imaging/tests.  Extensive time spent with review of outside medical records and complex urologic history.  BNickolas Madrid MD 07/29/2021  [facility] [address] [place] [phone]

## 2021-07-30 ENCOUNTER — Telehealth: Payer: Self-pay

## 2021-07-30 DIAGNOSIS — R9721 Rising PSA following treatment for malignant neoplasm of prostate: Secondary | ICD-10-CM

## 2021-07-30 LAB — PSA: Prostate Specific Ag, Serum: 4 ng/mL (ref 0.0–4.0)

## 2021-07-30 NOTE — Telephone Encounter (Signed)
Called pt informed him of the information below. Pt voiced understanding. Scan ordered.

## 2021-07-30 NOTE — Telephone Encounter (Signed)
-----   Message from Billey Co, MD sent at 07/30/2021 11:03 AM EDT ----- Unfortunately PSA continues to increase and is now 4.  I would recommend PSMA PET scan as we discussed in clinic, and will call with those results  Nickolas Madrid, MD 07/30/2021

## 2021-08-11 ENCOUNTER — Other Ambulatory Visit: Payer: Self-pay

## 2021-08-11 ENCOUNTER — Ambulatory Visit
Admission: RE | Admit: 2021-08-11 | Discharge: 2021-08-11 | Disposition: A | Discharge status: Home / Self Care | Payer: Medicare Other | Source: Ambulatory Visit | Attending: Urology | Admitting: Urology

## 2021-08-11 DIAGNOSIS — R9721 Rising PSA following treatment for malignant neoplasm of prostate: Secondary | ICD-10-CM | POA: Diagnosis present

## 2021-08-11 DIAGNOSIS — I700 Atherosclerosis of aorta: Secondary | ICD-10-CM | POA: Diagnosis not present

## 2021-08-11 MED ORDER — PIFLIFOLASTAT F 18 (PYLARIFY) INJECTION
9.0000 | Freq: Once | INTRAVENOUS | Status: AC
  Administered 2021-08-11: 9.73 via INTRAVENOUS

## 2021-08-17 ENCOUNTER — Other Ambulatory Visit: Payer: Self-pay

## 2021-08-17 DIAGNOSIS — Z8546 Personal history of malignant neoplasm of prostate: Secondary | ICD-10-CM

## 2021-08-26 ENCOUNTER — Other Ambulatory Visit: Payer: BLUE CROSS/BLUE SHIELD | Admitting: Urology

## 2021-10-20 ENCOUNTER — Ambulatory Visit (INDEPENDENT_AMBULATORY_CARE_PROVIDER_SITE_OTHER): Payer: Medicare Other | Admitting: Urology

## 2021-10-20 ENCOUNTER — Other Ambulatory Visit: Payer: Self-pay | Admitting: Urology

## 2021-10-20 ENCOUNTER — Encounter: Payer: Self-pay | Admitting: Urology

## 2021-10-20 VITALS — Ht 70.0 in | Wt 283.0 lb

## 2021-10-20 DIAGNOSIS — Z8546 Personal history of malignant neoplasm of prostate: Secondary | ICD-10-CM

## 2021-10-20 DIAGNOSIS — N35912 Unspecified bulbous urethral stricture, male: Secondary | ICD-10-CM

## 2021-10-20 DIAGNOSIS — R3912 Poor urinary stream: Secondary | ICD-10-CM | POA: Diagnosis not present

## 2021-10-20 DIAGNOSIS — R3916 Straining to void: Secondary | ICD-10-CM

## 2021-10-20 DIAGNOSIS — R9721 Rising PSA following treatment for malignant neoplasm of prostate: Secondary | ICD-10-CM

## 2021-10-20 DIAGNOSIS — N35919 Unspecified urethral stricture, male, unspecified site: Secondary | ICD-10-CM

## 2021-10-20 LAB — URINALYSIS, COMPLETE
Bilirubin, UA: NEGATIVE
Glucose, UA: NEGATIVE
Ketones, UA: NEGATIVE
Leukocytes,UA: NEGATIVE
Nitrite, UA: NEGATIVE
Protein,UA: NEGATIVE
Specific Gravity, UA: <1.005 — ABNORMAL LOW (ref 1.005–1.030)
Urobilinogen, Ur: 0.2 mg/dL (ref 0.2–1.0)
pH, UA: 5.5 (ref 5.0–7.5)

## 2021-10-20 LAB — MICROSCOPIC EXAMINATION: Bacteria, UA: NONE SEEN

## 2021-10-20 NOTE — Progress Notes (Signed)
Cystoscopy Procedure Note:  Indication: Weak urinary stream, straining  66 year old male previously followed in [place], he underwent a robotic radical prostatectomy by Dr. Alinda Money in 2008(3+4=7 prostate adenocarcinoma, extraprostatic extension present, focally positive margin at the left posterior, negative lymph node) and reportedly had a biochemical recurrence of 0.29 in 2019 that was treated with radiation.  He then had significant persistent rise in his PSA to 2.9, and PSMA PET scan in December 13, 2020 showed no evidence of recurrence.  PSA actually decreased to 2.25 in January 2023, and the patient and Dr. Alinda Money opted to continue close PSA monitoring.  PSA in July 2023 increased to 4, but PSMA PET scan showed no evidence of metastatic disease and he opted to continue observation.  After informed consent and discussion of the procedure and its risks, Eldred Sooy was positioned and prepped in the standard fashion. Cystoscopy was performed with a flexible cystoscope.  In the bulbourethral there was a ~1cm long stricture measuring approximately 57F, unable to bypass with scope.  Beyond this there appeared to be a second shorter area of narrowing closer to the sphincter.  Findings: Bulbourethral  stricture  --------------------------------------------------------------------------------------------------  Assessment and Plan: 66 year old male with history of radical prostatectomy in 2008 for intermediate risk disease with positive margin, biochemical recurrence in 2019 treated with radiation, and ongoing rise in the PSA(most recently 4.0) with negative PSMA PET scan.  Per AUA guideline recommendations on advanced prostate cancer, he has been on observation with serial PSA monitoring.  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  I recommended cystoscopy, retrograde  urethrogram, and Optilume balloon dilation.  We reviewed the 20% risk of recurrence at length, as well as need for temporary Foley catheter placement for 1 to 2 days, temporary exacerbation urinary symptoms including dysuria/urgency/frequency/incontinence, and low risk of bleeding or infection.  We also discussed potential need for more aggressive treatments in the future like urethroplasty, but with his morbid obesity of 42 this would certainly be a challenging procedure that would need to be undertaken at a tertiary care facility.  He is in agreement to pursue optimal balloon dilation.  Schedule cystoscopy, retrograde urethrogram, Optilume balloon dilation  Nickolas Madrid, MD 10/20/2021

## 2021-10-20 NOTE — Patient Instructions (Signed)
We found a urethral stricture likely from your prior radiation.  The easiest way to fix this is with a balloon dilation(optilume balloon).  This balloon is also coated with a medication that can help prevent recurrence.  There is an approximately 20% chance that scar tissue can return in the future.  This is a low risk procedure that is done as an outpatient and takes 15 to 20 minutes, but you will need a catheter for 1 to 2 days afterwards.  We will call to schedule.   Urethral Stricture  Urethral stricture is narrowing of the tube (urethra) that carries urine from the bladder out of the body. The urethra can become narrow due to scar tissue from an injury or infection. This can make it difficult to pass urine. In women, the urethra opens above the vaginal opening. In men, the urethra opens at the tip of the penis, and the urethra is much longer than it is in women. Because of the length of the male urethra, urethral stricture is much more common in men. This condition is treated with surgery. What are the causes? In both men and women, common causes of urethral stricture include: Urinary tract infection (UTI). Sexually transmitted infection (STI). Use of a tube placed into the urethra to drain urine from the bladder (urinary catheter). Urinary tract surgery. In men, common causes of urethral stricture include: A severe injury to the pelvis. Prostate surgery. Injury to the penis. In many cases, the cause of urethral stricture is not known. What increases the risk? You are more likely to develop this condition if you: Are male. Men who have had prostate surgery are at risk of developing this condition. Use a urinary catheter. Have had urinary tract surgery. What are the signs or symptoms? The main symptom of this condition is difficulty passing urine. This may cause decreased urine flow, dribbling, or spraying of urine. Other symptom of this condition may include: Frequent UTIs. Blood in  the urine. Pain when urinating. Swelling of the penis in men. Inability to pass urine (urinary obstruction). How is this diagnosed? This condition may be diagnosed based on: Your medical history and a physical exam. Urine tests to check for infection or bleeding. X-rays. Ultrasound. Retrograde urethrogram. This is a type of test in which dye is injected into the urethra and then an X-ray is taken. Urethroscopy. This is when a thin tube with a light and camera on the end (urethroscope) is used to look at the urethra. How is this treated? This condition is treated with surgery. The type of surgery that you have depends on the severity of your condition. You may have: Urethral dilation. In this procedure, the narrow part of the urethra is stretched open (dilated) with dilating instruments or a small balloon. Urethrotomy. In this procedure, a urethroscope is placed into the urethra, and the narrow part of the urethra is cut open with a surgical blade inserted through the urethroscope. Open surgery. In this procedure, an incision is made in the urethra, the narrow part is removed, and the urethra is reconstructed. Follow these instructions at home:  Take over-the-counter and prescription medicines only as told by your health care provider. If you were prescribed an antibiotic medicine, take it as told by your health care provider. Do not stop taking the antibiotic even if you start to feel better. Drink enough fluid to keep your urine pale yellow. Keep all follow-up visits as told by your health care provider. This is important. Contact a  health care provider if: You have signs of a urinary tract infection, such as: Frequent urination or passing small amounts of urine frequently. Needing to urinate urgently. Pain or burning with urination. Urine that smells bad or unusual. Cloudy urine. Pain in the lower abdomen or back. Trouble urinating. Blood in the urine. Vomiting or being less hungry  than normal. Diarrhea or abdominal pain. Vaginal discharge, if you are male. Your symptoms are getting worse instead of better. Get help right away if: You cannot pass urine. You have a fever. You have swelling, bruising, or discoloration of your genital area. This includes the penis, scrotum, and inner thighs for men, and the outer genital organs (vulva) and inner thighs for women. You develop swelling in your legs. You have difficulty breathing. Summary Urethral stricture is narrowing of the tube (urethra) that carries urine from the bladder out of the body. The urethra can become narrow due to scar tissue from an injury or infection. This condition can make it difficult to pass urine. This condition is treated with surgery. The type of surgery that you have depends on the severity of your condition. Contact a health care provider if your symptoms get worse or you have signs of a urinary tract infection. This information is not intended to replace advice given to you by your health care provider. Make sure you discuss any questions you have with your health care provider. Document Revised: 11/17/2020 Document Reviewed: 11/17/2020 Elsevier Patient Education  [place].

## 2021-10-20 NOTE — Progress Notes (Signed)
Surgical Physician Order Form [facility]  * Scheduling expectation : Next Available  *Length of Case: 30 minutes  *Clearance needed: no  *Anticoagulation Instructions: Hold all anticoagulants  *Aspirin Instructions: Hold Aspirin  *Post-op visit Date/Instructions:  1-3 day cath removal with PA  *Diagnosis: Urethral Stricture  *Procedure: Cystoscopy, retrograde urethrogram, Optilume balloon dilation   Additional orders: N/A  -Admit type: OUTpatient  -Anesthesia: General  -VTE Prophylaxis Standing Order SCD's       Other:   -Standing Lab Orders Per Anesthesia    Lab other: UA&Urine Culture (sent 9/27)  -Standing Test orders EKG/Chest x-ray per Anesthesia       Test other:   - Medications:  Ancef 2gm IV  -Other orders:  N/A

## 2021-10-23 LAB — CULTURE, URINE COMPREHENSIVE

## 2021-11-02 ENCOUNTER — Telehealth: Payer: Self-pay

## 2021-11-02 NOTE — Progress Notes (Signed)
Francisco Freeman Urological Surgery Posting Form   Surgery Date/Time: Date: 11/12/2021  Surgeon: Dr. Nickolas Madrid, MD  Surgery Location: Day Surgery  Inpt ( No  )   Outpt (Yes)   Obs ( No  )   Diagnosis: N35.919 Urethral Stricture  -CPT: 52000, 57017, 79390  Surgery: Cystoscopy with Urethral Balloon Dilation with Optilume and Retrograde Urethrogram  Stop Anticoagulations: Yes and hold ASA  Cardiac/Medical/Pulmonary Clearance needed: no  *Orders entered into EPIC  Date: 11/02/21   *Case booked in EPIC  Date: 10/28/2021  *Notified pt of Surgery: Date: 10/28/2021  PRE-OP UA & CX: yes, obtained in clinic on 10/20/2021  *Placed into Prior Authorization Work [name] Date: 11/02/21   Assistant/laser/rep:No

## 2021-11-02 NOTE — Telephone Encounter (Signed)
I spoke with Mr. Baumgartner. We have discussed possible surgery dates and Friday October 20th, 2023 was agreed upon by all parties. Patient given information about surgery date, what to expect pre-operatively and post operatively.  We discussed that a Pre-Admission Testing office will be calling to set up the pre-op visit that will take place prior to surgery, and that these appointments are typically done over the phone with a Pre-Admissions RN.  Informed patient that our office will communicate any additional care to be provided after surgery. Patients questions or concerns were discussed during our call. Advised to call our office should there be any additional information, questions or concerns that arise. Patient verbalized understanding.

## 2021-11-08 ENCOUNTER — Encounter
Admission: RE | Admit: 2021-11-08 | Discharge: 2021-11-08 | Disposition: A | Discharge status: Home / Self Care | Payer: Medicare Other | Source: Ambulatory Visit | Attending: Urology | Admitting: Urology

## 2021-11-08 ENCOUNTER — Other Ambulatory Visit: Payer: Self-pay

## 2021-11-08 VITALS — Ht 70.0 in | Wt 283.0 lb

## 2021-11-08 DIAGNOSIS — I10 Essential (primary) hypertension: Secondary | ICD-10-CM

## 2021-11-08 HISTORY — DX: Sleep apnea, unspecified: G47.30

## 2021-11-08 NOTE — Patient Instructions (Addendum)
Your procedure is scheduled on: 11/12/21 Report to [place]. To find out your arrival time please call [phone] between 1PM - 3PM on 11/11/21.  Remember: Instructions that are not followed completely may result in serious medical risk, up to and including death, or upon the discretion of your surgeon and anesthesiologist your surgery may need to be rescheduled.     _X__ 1. Do not eat food after midnight the night before your procedure.                 No gum chewing or hard candies.   __X__2.  On the morning of surgery brush your teeth with toothpaste and water, you                 may rinse your mouth with mouthwash if you wish.  Do not swallow any              toothpaste of mouthwash.     _X__ 3.  No Alcohol for 24 hours before or after surgery.   _X__ 4.  Do Not Smoke or use e-cigarettes For 24 Hours Prior to Your Surgery.                 Do not use any chewable tobacco products for at least 6 hours prior to                 surgery.  ____  5.  Bring all medications with you on the day of surgery if instructed.   __X__  6.  Notify your doctor if there is any change in your medical condition      (cold, fever, infections).     Do not wear jewelry, make-up, hairpins, clips or nail polish. Do not wear lotions, powders, or perfumes.  Do not shave body hair 48 hours prior to surgery. Men may shave face and neck. Do not bring valuables to the hospital.    [facility] is not responsible for any belongings or valuables.  Contacts, dentures/partials or body piercings may not be worn into surgery. Bring a case for your contacts, glasses or hearing aids, a denture cup will be supplied. Leave your suitcase in the car. After surgery it may be brought to your room. For patients admitted to the hospital, discharge time is determined by your treatment team.   Patients discharged the day of surgery will not be allowed to drive  home.   __X__ Take these medicines the morning of surgery with A SIP OF WATER:    1. atenolol (TENORMIN) 25 MG tablet  2.   3.   4.  5.  6.  ____ Fleet Enema (as directed)   __ _ Use CHG Soap/SAGE wipes as directed  ____ Use inhalers on the day of surgery  ____ Stop metformin/Janumet/Farxiga 2 days prior to surgery    ____ Take 1/2 of usual insulin dose the night before surgery. No insulin the morning          of surgery.   ____ Stop Blood Thinners Coumadin/Plavix/Xarelto/Pleta/Pradaxa/Eliquis/Effient/Aspirin  on   Or contact your Surgeon, Cardiologist or Medical Doctor regarding  ability to stop your blood thinners  __X__ Stop Anti-inflammatories 7 days before surgery such as Advil, Ibuprofen, Motrin,  BC or Goodies Powder, Naprosyn, Naproxen, Aleve, Aspirin    __X__ Stop all herbal supplements, fish oil or vitamin E until after surgery.    __X__ Bring C-Pap to the hospital.

## 2021-11-10 ENCOUNTER — Inpatient Hospital Stay: Admission: RE | Admit: 2021-11-10 | Payer: BLUE CROSS/BLUE SHIELD | Source: Ambulatory Visit

## 2021-11-10 ENCOUNTER — Other Ambulatory Visit: Payer: BLUE CROSS/BLUE SHIELD

## 2021-11-12 ENCOUNTER — Ambulatory Visit: Payer: Medicare Other

## 2021-11-12 ENCOUNTER — Ambulatory Visit: Payer: Medicare Other | Admitting: General Practice

## 2021-11-12 ENCOUNTER — Other Ambulatory Visit: Payer: Self-pay

## 2021-11-12 ENCOUNTER — Encounter
Admission: RE | Disposition: A | Discharge status: Home / Self Care | Payer: Self-pay | Source: Home / Self Care | Attending: Urology

## 2021-11-12 ENCOUNTER — Encounter: Payer: Self-pay | Admitting: Urology

## 2021-11-12 ENCOUNTER — Ambulatory Visit
Admission: RE | Admit: 2021-11-12 | Discharge: 2021-11-12 | Disposition: A | Discharge status: Home / Self Care | Payer: Medicare Other | Attending: Urology | Admitting: Urology

## 2021-11-12 DIAGNOSIS — N35919 Unspecified urethral stricture, male, unspecified site: Secondary | ICD-10-CM

## 2021-11-12 DIAGNOSIS — Z923 Personal history of irradiation: Secondary | ICD-10-CM | POA: Insufficient documentation

## 2021-11-12 DIAGNOSIS — G4730 Sleep apnea, unspecified: Secondary | ICD-10-CM | POA: Insufficient documentation

## 2021-11-12 DIAGNOSIS — Z6841 Body Mass Index (BMI) 40.0 and over, adult: Secondary | ICD-10-CM | POA: Diagnosis not present

## 2021-11-12 DIAGNOSIS — Z87442 Personal history of urinary calculi: Secondary | ICD-10-CM | POA: Diagnosis not present

## 2021-11-12 DIAGNOSIS — N35912 Unspecified bulbous urethral stricture, male: Secondary | ICD-10-CM | POA: Diagnosis present

## 2021-11-12 DIAGNOSIS — I10 Essential (primary) hypertension: Secondary | ICD-10-CM | POA: Diagnosis not present

## 2021-11-12 DIAGNOSIS — Z8546 Personal history of malignant neoplasm of prostate: Secondary | ICD-10-CM | POA: Insufficient documentation

## 2021-11-12 HISTORY — PX: BALLOON DILATION: SHX5330

## 2021-11-12 HISTORY — PX: CYSTOSCOPY WITH RETROGRADE URETHROGRAM: SHX6309

## 2021-11-12 SURGERY — CYSTOSCOPY WITH RETROGRADE URETHROGRAM
Anesthesia: General

## 2021-11-12 MED ORDER — FAMOTIDINE 20 MG PO TABS
ORAL_TABLET | ORAL | Status: AC
  Administered 2021-11-12: 20 mg via ORAL
  Filled 2021-11-12: qty 1

## 2021-11-12 MED ORDER — FENTANYL CITRATE (PF) 100 MCG/2ML IJ SOLN
INTRAMUSCULAR | Status: AC
  Filled 2021-11-12: qty 2

## 2021-11-12 MED ORDER — ORAL CARE MOUTH RINSE: 15.0000 mL | Freq: Once | OROMUCOSAL | Status: AC

## 2021-11-12 MED ORDER — CHLORHEXIDINE GLUCONATE 0.12 % MT SOLN: 15.0000 mL | Freq: Once | OROMUCOSAL | Status: AC

## 2021-11-12 MED ORDER — CHLORHEXIDINE GLUCONATE 0.12 % MT SOLN
OROMUCOSAL | Status: AC
  Administered 2021-11-12: 15 mL via OROMUCOSAL
  Filled 2021-11-12: qty 15

## 2021-11-12 MED ORDER — SUGAMMADEX SODIUM 500 MG/5ML IV SOLN
INTRAVENOUS | Status: DC | PRN
  Administered 2021-11-12: 500 mg via INTRAVENOUS

## 2021-11-12 MED ORDER — DEXAMETHASONE SODIUM PHOSPHATE 10 MG/ML IJ SOLN
INTRAMUSCULAR | Status: AC
  Filled 2021-11-12: qty 1

## 2021-11-12 MED ORDER — ROCURONIUM BROMIDE 100 MG/10ML IV SOLN
INTRAVENOUS | Status: DC | PRN
  Administered 2021-11-12: 70 mg via INTRAVENOUS

## 2021-11-12 MED ORDER — GLYCOPYRROLATE 0.2 MG/ML IJ SOLN
INTRAMUSCULAR | Status: DC | PRN
  Administered 2021-11-12: .2 mg via INTRAVENOUS

## 2021-11-12 MED ORDER — LACTATED RINGERS IV SOLN: INTRAVENOUS | Status: DC

## 2021-11-12 MED ORDER — LIDOCAINE HCL (CARDIAC) PF 100 MG/5ML IV SOSY
PREFILLED_SYRINGE | INTRAVENOUS | Status: DC | PRN
  Administered 2021-11-12: 100 mg via INTRAVENOUS

## 2021-11-12 MED ORDER — STERILE WATER FOR IRRIGATION IR SOLN
Status: DC | PRN
  Administered 2021-11-12: 500 mL

## 2021-11-12 MED ORDER — EPHEDRINE 5 MG/ML INJ
INTRAVENOUS | Status: AC
  Filled 2021-11-12: qty 5

## 2021-11-12 MED ORDER — PHENYLEPHRINE 80 MCG/ML (10ML) SYRINGE FOR IV PUSH (FOR BLOOD PRESSURE SUPPORT)
PREFILLED_SYRINGE | INTRAVENOUS | Status: AC
  Filled 2021-11-12: qty 10

## 2021-11-12 MED ORDER — ACETAMINOPHEN 10 MG/ML IV SOLN
INTRAVENOUS | Status: AC
  Filled 2021-11-12: qty 100

## 2021-11-12 MED ORDER — PROPOFOL 10 MG/ML IV BOLUS
INTRAVENOUS | Status: DC | PRN
  Administered 2021-11-12: 200 mg via INTRAVENOUS

## 2021-11-12 MED ORDER — ROCURONIUM BROMIDE 10 MG/ML (PF) SYRINGE
PREFILLED_SYRINGE | INTRAVENOUS | Status: AC
  Filled 2021-11-12: qty 10

## 2021-11-12 MED ORDER — ACETAMINOPHEN 10 MG/ML IV SOLN
INTRAVENOUS | Status: DC | PRN
  Administered 2021-11-12: 1000 mg via INTRAVENOUS

## 2021-11-12 MED ORDER — CEFAZOLIN SODIUM-DEXTROSE 2-4 GM/100ML-% IV SOLN
INTRAVENOUS | Status: AC
  Filled 2021-11-12: qty 100

## 2021-11-12 MED ORDER — PHENYLEPHRINE 80 MCG/ML (10ML) SYRINGE FOR IV PUSH (FOR BLOOD PRESSURE SUPPORT)
PREFILLED_SYRINGE | INTRAVENOUS | Status: DC | PRN
  Administered 2021-11-12: 80 ug via INTRAVENOUS

## 2021-11-12 MED ORDER — FENTANYL CITRATE (PF) 100 MCG/2ML IJ SOLN: 25.0000 ug | INTRAMUSCULAR | Status: DC | PRN

## 2021-11-12 MED ORDER — SUGAMMADEX SODIUM 500 MG/5ML IV SOLN
INTRAVENOUS | Status: AC
  Filled 2021-11-12: qty 5

## 2021-11-12 MED ORDER — IOHEXOL 180 MG/ML  SOLN
INTRAMUSCULAR | Status: DC | PRN
  Administered 2021-11-12: 10 mL

## 2021-11-12 MED ORDER — ONDANSETRON HCL 4 MG/2ML IJ SOLN
INTRAMUSCULAR | Status: AC
  Filled 2021-11-12: qty 2

## 2021-11-12 MED ORDER — ONDANSETRON HCL 4 MG/2ML IJ SOLN
INTRAMUSCULAR | Status: DC | PRN
  Administered 2021-11-12: 4 mg via INTRAVENOUS

## 2021-11-12 MED ORDER — LIDOCAINE HCL (PF) 2 % IJ SOLN
INTRAMUSCULAR | Status: AC
  Filled 2021-11-12: qty 5

## 2021-11-12 MED ORDER — HYDROCODONE-ACETAMINOPHEN 5-325 MG PO TABS: 1.0000 | ORAL_TABLET | Freq: Four times a day (QID) | ORAL | 0 refills | Status: AC | PRN

## 2021-11-12 MED ORDER — FENTANYL CITRATE (PF) 100 MCG/2ML IJ SOLN
INTRAMUSCULAR | Status: DC | PRN
  Administered 2021-11-12 (×3): 25 ug via INTRAVENOUS

## 2021-11-12 MED ORDER — CEFAZOLIN SODIUM-DEXTROSE 2-4 GM/100ML-% IV SOLN
2.0000 g | INTRAVENOUS | Status: AC
  Administered 2021-11-12: 2 g via INTRAVENOUS

## 2021-11-12 MED ORDER — OXYCODONE HCL 5 MG/5ML PO SOLN: 5.0000 mg | Freq: Once | ORAL | Status: AC | PRN

## 2021-11-12 MED ORDER — ATROPINE SULFATE 0.4 MG/ML IV SOLN
INTRAVENOUS | Status: AC
  Filled 2021-11-12: qty 1

## 2021-11-12 MED ORDER — PROPOFOL 10 MG/ML IV BOLUS
INTRAVENOUS | Status: AC
  Filled 2021-11-12: qty 20

## 2021-11-12 MED ORDER — FAMOTIDINE 20 MG PO TABS: 20.0000 mg | ORAL_TABLET | Freq: Once | ORAL | Status: AC

## 2021-11-12 MED ORDER — EPHEDRINE SULFATE (PRESSORS) 50 MG/ML IJ SOLN
INTRAMUSCULAR | Status: DC | PRN
  Administered 2021-11-12: 10 mg via INTRAVENOUS
  Administered 2021-11-12: 15 mg via INTRAVENOUS

## 2021-11-12 MED ORDER — OXYCODONE HCL 5 MG PO TABS
ORAL_TABLET | ORAL | Status: AC
  Filled 2021-11-12: qty 1

## 2021-11-12 MED ORDER — DEXAMETHASONE SODIUM PHOSPHATE 10 MG/ML IJ SOLN
INTRAMUSCULAR | Status: DC | PRN
  Administered 2021-11-12: 5 mg via INTRAVENOUS

## 2021-11-12 MED ORDER — OXYCODONE HCL 5 MG PO TABS
5.0000 mg | ORAL_TABLET | Freq: Once | ORAL | Status: AC | PRN
  Administered 2021-11-12: 5 mg via ORAL

## 2021-11-12 SURGICAL SUPPLY — 34 items
BAG DRN RND TRDRP ANRFLXCHMBR (UROLOGICAL SUPPLIES) ×1
BAG URINE DRAIN 2000ML AR STRL (UROLOGICAL SUPPLIES) ×2 IMPLANT
BALLN NEPHROMAX 24X8X12 (BALLOONS)
BALLN OPTILUME DCB 30X3X75 (BALLOONS) ×1
BALLN OPTILUME DCB 30X5X75 (BALLOONS)
BALLN URETL DIL 7X10 (BALLOONS)
BALLN URETL DIL 7X4 (MISCELLANEOUS)
BALLOON NEPHROMAX 24X8X12 (BALLOONS) IMPLANT
BALLOON OPTILUME DCB 30X3X75 (BALLOONS) IMPLANT
BALLOON OPTILUME DCB 30X5X75 (BALLOONS) IMPLANT
BALLOON URETL DIL 7X10 (BALLOONS) IMPLANT
BALLOON URETL DIL 7X4 (MISCELLANEOUS) IMPLANT
BAND RUBBER 3X1/6 STRL (MISCELLANEOUS) ×2 IMPLANT
BAND RUBBER 3X1/6 TAN STRL (MISCELLANEOUS) ×1 IMPLANT
BRUSH SCRUB EZ 1% IODOPHOR (MISCELLANEOUS) ×2 IMPLANT
CATH FOL 2WAY LX 16X5 (CATHETERS) IMPLANT
CATH FOLEY 2W COUNCIL 5CC 16FR (CATHETERS) ×2 IMPLANT
CATH FOLEY 2W COUNCIL 5CC 18FR (CATHETERS) ×2 IMPLANT
ELECT REM PT RETURN 9FT ADLT (ELECTROSURGICAL)
ELECTRODE REM PT RTRN 9FT ADLT (ELECTROSURGICAL) IMPLANT
GLOVE SURG UNDER POLY LF SZ7.5 (GLOVE) ×2 IMPLANT
GOWN STRL REUS W/ TWL XL LVL3 (GOWN DISPOSABLE) ×2 IMPLANT
GOWN STRL REUS W/TWL XL LVL3 (GOWN DISPOSABLE) ×1
GUIDEWIRE STR DUAL SENSOR (WIRE) ×2 IMPLANT
MANIFOLD NEPTUNE II (INSTRUMENTS) ×2 IMPLANT
PACK CYSTO AR (MISCELLANEOUS) ×2 IMPLANT
SET CYSTO W/LG BORE CLAMP LF (SET/KITS/TRAYS/PACK) ×2 IMPLANT
SYR 10ML LL (SYRINGE) ×2 IMPLANT
SYR 30ML LL (SYRINGE) ×2 IMPLANT
SYR TOOMEY IRRIG 70ML (MISCELLANEOUS) ×1
SYRINGE TOOMEY IRRIG 70ML (MISCELLANEOUS) ×2 IMPLANT
TRAP FLUID SMOKE EVACUATOR (MISCELLANEOUS) ×2 IMPLANT
WATER STERILE IRR 1000ML POUR (IV SOLUTION) ×2 IMPLANT
WATER STERILE IRR 500ML POUR (IV SOLUTION) ×2 IMPLANT

## 2021-11-12 NOTE — Op Note (Addendum)
Date of procedure: 11/12/21  Preoperative diagnosis:  Bulbar urethral stricture Prostate cancer  Postoperative diagnosis:  Same  Procedure: Retrograde urethrogram with intraoperative interpretation Cystoscopy Optilume urethral balloon dilation  Surgeon: Nickolas Madrid, MD  Anesthesia: General  Complications: None  Intraoperative findings:  Retrogram urethrogram with 1 cm bulbourethral stricture, ~1E Uncomplicated optilume balloon dilation of urethral stricture Mild radiation cystitis changes at bladder neck, prostate surgically absent Normal appearing bladder  EBL: Minimal  Specimens: None  Drains: 18 French council Foley  Indication: Francisco Freeman is a 66 y.o. patient with history of prostate cancer status post radical prostatectomy with Dr. Alinda Money in 2008, biochemical recurrence in 2019 treated with radiation, with new rise in the PSA over the last year with negative PSMA PET scan.  Recent developed worsening urinary symptoms and was found to have a bulbar urethral stricture on cystoscopy.  After reviewing the management options for treatment, they elected to proceed with the above surgical procedure(s). We have discussed the potential benefits and risks of the procedure, side effects of the proposed treatment, the likelihood of the patient achieving the goals of the procedure, and any potential problems that might occur during the procedure or recuperation. Informed consent has been obtained.  Description of procedure:  The patient was taken to the operating room and general anesthesia was induced. SCDs were placed for DVT prophylaxis.  A preoperative timeout was performed, and preoperative antibiotics were given.  The left hip was bumped, and contrast injected into the urethra via [place] syringe for a retrograde urethrogram.  This showed a 1 cm area of stricture at the bulbar urethra.  The patient was placed in the dorsal lithotomy position, prepped and draped in  the usual sterile fashion.  A 21 French rigid cystoscope was used to intubate the urethra and followed proximally towards the bladder.  There was a approximately 7 French stricture in the proximal urethra.  A sensor wire was passed through the opening into the bladder.  A 24 French UroMax dilating balloon was advanced over the wire and inflated to a pressure of 18 ATM.  The balloon was then deflated and removed and I was able to navigate the rigid cystoscope into the bladder.  The length of the stricture appeared to be 1 cm.  There were some mild changes from radiation cystitis at the bladder neck, the prostate was surgically absent, and the bladder was grossly normal-appearing with ureteral orifices orthotopic bilaterally.  A 30 French by 5 cm Optilume balloon was then advanced over the wire to the location of the stricture, confirmed under direct vision with low flow with the cystoscope, and inflated to 10 ATM.  After just 1 minute I heard the balloon pop, and the removal of the balloon confirmed this.  I then readvanced a new 30 [place] by 3 cm Optilume balloon over the wire, and again position was confirmed under low flow with the cystoscope.  This balloon was inflated to 10 ATM and kept in place for a total of 5 minutes.  The balloon was then deflated, and a 18 [place] council Foley passed easily over the wire into the bladder with return of light pink urine.  10 mL were placed in the balloon, and the catheter was connected to drainage.   Disposition: Stable to PACU  Plan: Foley removal in clinic in 2 days Follow-up with MD in 2 to 3 months for PVR  Nickolas Madrid, MD

## 2021-11-12 NOTE — Transfer of Care (Signed)
Immediate Anesthesia Transfer of Care Note  Patient: Francisco Freeman  Procedure(s) Performed: CYSTOSCOPY WITH RETROGRADE URETHROGRAM OPTILUME BALLOON DILATION  Patient Location: PACU  Anesthesia Type:General  Level of Consciousness: drowsy  Airway & Oxygen Therapy: Patient Spontanous Breathing  Post-op Assessment: Report given to RN and Post -op Vital signs reviewed and stable  Post vital signs: Reviewed and stable  Last Vitals:  Vitals Value Taken Time  BP 153/96 11/12/21 1319  Temp    Pulse 56 11/12/21 1322  Resp 18 11/12/21 1323  SpO2 97 % 11/12/21 1322  Vitals shown include unvalidated device data.  Last Pain:  Vitals:   11/12/21 0917  TempSrc: Temporal  PainSc: 0-No pain         Complications: No notable events documented.

## 2021-11-12 NOTE — H&P (Signed)
11/12/21 12:09 PM   Francisco Freeman [birth year] [id number]  CC: urethral stricture  HPI: 66 year old male with history of radical prostatectomy in 2008 for intermediate risk disease with positive margin, biochemical recurrence in 2019 treated with radiation, and ongoing rise in the PSA(most recently 4.0) with negative PSMA PET scan.  Per AUA guideline recommendations on advanced prostate cancer, he has been on observation with serial PSA monitoring.  He has developed worsening urinary symptoms with weak stream and straining over the last month, in clinic cystoscopy confirmed bulbourethral stricture.   I recommended cystoscopy, retrograde urethrogram, and Optilume balloon dilation.  We reviewed the 20% risk of recurrence at length, as well as need for temporary Foley catheter placement for 1 to 2 days, temporary exacerbation urinary symptoms including dysuria/urgency/frequency/incontinence, and low risk of bleeding or infection.  We also discussed potential need for more aggressive treatments in the future like urethroplasty, but with his morbid obesity of 42 this would certainly be a challenging procedure that would need to be undertaken at a tertiary care facility.  He is in agreement to pursue optimal balloon dilation.   PMH: Past Medical History:  Diagnosis Date   Arthritis    Gout   Edema    Gout    Gout    Hypertension    Prostate cancer ([place])    Renal stones    Sleep apnea     Surgical History: Past Surgical History:  Procedure Laterality Date   COLONOSCOPY     COLONOSCOPY N/A 12/28/2016   Procedure: COLONOSCOPY;  Surgeon: Manya Silvas, MD;  Location: [facility] ENDOSCOPY;  Service: Endoscopy;  Laterality: N/A;   PROSTATE SURGERY     robot assisted prostatectomy   PROSTATECTOMY  01/24/2006   TONSILLECTOMY       Family History: Family History  Problem Relation Age of Onset   Cancer Mother 22       breast   Cancer Father        colon   Hypertension Father      Social History:  reports that he has never smoked. He has never been exposed to tobacco smoke. He has never used smokeless tobacco. He reports current alcohol use of about 1.0 standard drink of alcohol per week. He reports that he does not use drugs.  Physical Exam: BP (!) 146/92   Pulse (!) 46   Temp (!) 97.3 F (36.3 C) (Temporal)   Resp 16   Ht '5\' 10"'$  (1.778 m)   Wt 128.4 kg   SpO2 99%   BMI 40.62 kg/m    Constitutional:  Alert and oriented, No acute distress. Cardiovascular: Regular rate and rhythm Respiratory: Clear to auscultation bilaterally GI: Abdomen is soft, nontender, nondistended, no abdominal masses  Laboratory Data: Urinalysis 11/03/2021 benign  Assessment & Plan:   66 year old male with history of radical prostatectomy in 2008 for intermediate risk disease with positive margin, biochemical recurrence in 2019 treated with radiation, and ongoing rise in the PSA(most recently 4.0) with negative PSMA PET scan.  Per AUA guideline recommendations on advanced prostate cancer, he has been on observation with serial PSA monitoring.  He has developed worsening urinary symptoms with weak stream and straining over the last month, in clinic cystoscopy confirmed bulbourethral stricture.   I recommended cystoscopy, retrograde urethrogram, and Optilume balloon dilation.  We reviewed the 20% risk of recurrence at length, as well as need for temporary Foley catheter placement for 1 to 2 days, temporary exacerbation urinary symptoms including dysuria/urgency/frequency/incontinence, and low  risk of bleeding or infection.  We also discussed potential need for more aggressive treatments in the future like urethroplasty, but with his morbid obesity of 42 this would certainly be a challenging procedure that would need to be undertaken at a tertiary care facility.  He is in agreement to pursue optimal balloon dilation.  Retrograde urethrogram, cystoscopy, optical and balloon dilation  Nickolas Madrid, MD 11/12/2021  [facility] [address], [place],  [address] [phone]

## 2021-11-12 NOTE — Discharge Instructions (Signed)

## 2021-11-12 NOTE — Anesthesia Preprocedure Evaluation (Signed)
Anesthesia Evaluation  Patient identified by MRN, date of birth, ID band Patient awake    Reviewed: Allergy & Precautions, NPO status , Patient's Chart, lab work & pertinent test results  History of Anesthesia Complications Negative for: history of anesthetic complications  Airway Mallampati: III  TM Distance: >3 FB Neck ROM: full    Dental  (+) Chipped   Pulmonary neg pulmonary ROS, neg sleep apnea, neg COPD, Not current smoker,    Pulmonary exam normal        Cardiovascular hypertension, Pt. on medications (-) Past MI and (-) CHF negative cardio ROS Normal cardiovascular exam(-) dysrhythmias (-) Valvular Problems/Murmurs Rhythm:irregular Rate:Bradycardia     Neuro/Psych neg Seizures negative neurological ROS  negative psych ROS   GI/Hepatic negative GI ROS, Neg liver ROS, neg GERD  ,  Endo/Other  negative endocrine ROSneg diabetesMorbid obesity  Renal/GU Renal diseasenegative Renal ROS  negative genitourinary   Musculoskeletal   Abdominal   Peds  Hematology negative hematology ROS (+)   Anesthesia Other Findings Past Medical History: No date: Arthritis     Comment:  Gout No date: Edema No date: Gout No date: Gout No date: Hypertension No date: Prostate cancer ([place]) No date: Renal stones No date: Sleep apnea  Past Surgical History: No date: COLONOSCOPY 12/28/2016: COLONOSCOPY; N/A     Comment:  Procedure: COLONOSCOPY;  Surgeon: Manya Silvas, MD;              Location: [facility] ENDOSCOPY;  Service: Endoscopy;                Laterality: N/A; No date: PROSTATE SURGERY     Comment:  robot assisted prostatectomy 01/24/2006: PROSTATECTOMY No date: TONSILLECTOMY  BMI    Body Mass Index: 40.62 kg/m      Reproductive/Obstetrics negative OB ROS                             Anesthesia Physical  Anesthesia Plan  ASA: 3  Anesthesia Plan: General   Post-op Pain Management:     Induction: Intravenous  PONV Risk Score and Plan: 3 and Propofol infusion and TIVA  Airway Management Planned: Nasal Cannula  Additional Equipment:   Intra-op Plan:   Post-operative Plan: Extubation in OR  Informed Consent: I have reviewed the patients History and Physical, chart, labs and discussed the procedure including the risks, benefits and alternatives for the proposed anesthesia with the patient or authorized representative who has indicated his/her understanding and acceptance.     Dental Advisory Given  Plan Discussed with: Anesthesiologist, CRNA and Surgeon  Anesthesia Plan Comments: (Patient consented for risks of anesthesia including but not limited to:  - adverse reactions to medications - damage to eyes, teeth, lips or other oral mucosa - nerve damage due to positioning  - sore throat or hoarseness - Damage to heart, brain, nerves, lungs, other parts of body or loss of life  Patient voiced understanding.)        Anesthesia Quick Evaluation

## 2021-11-15 ENCOUNTER — Ambulatory Visit (INDEPENDENT_AMBULATORY_CARE_PROVIDER_SITE_OTHER): Payer: Medicare Other | Admitting: Urology

## 2021-11-15 ENCOUNTER — Encounter: Payer: Self-pay | Admitting: Urology

## 2021-11-15 VITALS — Temp 97.7°F | Ht 70.0 in | Wt 288.0 lb

## 2021-11-15 DIAGNOSIS — R339 Retention of urine, unspecified: Secondary | ICD-10-CM

## 2021-11-15 NOTE — Anesthesia Postprocedure Evaluation (Signed)
Anesthesia Post Note  Patient: Francisco Freeman  Procedure(s) Performed: CYSTOSCOPY WITH RETROGRADE URETHROGRAM [place]  Patient location during evaluation: PACU Anesthesia Type: General Level of consciousness: awake and alert Pain management: pain level controlled Vital Signs Assessment: post-procedure vital signs reviewed and stable Respiratory status: spontaneous breathing, nonlabored ventilation, respiratory function stable and patient connected to nasal cannula oxygen Cardiovascular status: blood pressure returned to baseline and stable Postop Assessment: no apparent nausea or vomiting Anesthetic complications: no   No notable events documented.   Last Vitals:  Vitals:   11/12/21 1339 11/12/21 1357  BP: 130/86 (!) 149/91  Pulse: (!) 50 65  Resp: 16   Temp: (!) 36.1 C (!) 36.2 C  SpO2: 97% 96%    Last Pain:  Vitals:   11/15/21 0827  TempSrc:   PainSc: 0-No pain                 Dimas Millin

## 2021-11-15 NOTE — Progress Notes (Signed)
Catheter Removal  Patient is present today for a catheter removal.  37m of water was drained from the balloon. A 18FR foley cath was removed from the bladder, no complications were noted. Patient tolerated well.  Performed by: S.Lameka Disla  Follow up/ Additional notes: advised patient to contact the office if he did not void in the next few hours.

## 2021-12-14 ENCOUNTER — Other Ambulatory Visit: Payer: BLUE CROSS/BLUE SHIELD

## 2021-12-14 ENCOUNTER — Encounter: Payer: Self-pay | Admitting: Urology

## 2021-12-14 ENCOUNTER — Other Ambulatory Visit: Payer: Medicare Other

## 2021-12-17 ENCOUNTER — Other Ambulatory Visit: Payer: BLUE CROSS/BLUE SHIELD

## 2021-12-20 ENCOUNTER — Other Ambulatory Visit: Payer: Self-pay

## 2021-12-20 DIAGNOSIS — R399 Unspecified symptoms and signs involving the genitourinary system: Secondary | ICD-10-CM

## 2021-12-21 ENCOUNTER — Encounter: Payer: Self-pay | Admitting: Urology

## 2021-12-21 ENCOUNTER — Ambulatory Visit (INDEPENDENT_AMBULATORY_CARE_PROVIDER_SITE_OTHER): Payer: Medicare Other | Admitting: Urology

## 2021-12-21 ENCOUNTER — Other Ambulatory Visit
Admission: RE | Admit: 2021-12-21 | Discharge: 2021-12-21 | Disposition: A | Discharge status: Home / Self Care | Payer: Medicare Other | Attending: Urology | Admitting: Urology

## 2021-12-21 VITALS — BP 171/93 | HR 71 | Ht 70.0 in | Wt 295.0 lb

## 2021-12-21 DIAGNOSIS — R300 Dysuria: Secondary | ICD-10-CM

## 2021-12-21 DIAGNOSIS — R399 Unspecified symptoms and signs involving the genitourinary system: Secondary | ICD-10-CM

## 2021-12-21 LAB — URINALYSIS, COMPLETE (UACMP) WITH MICROSCOPIC
Bilirubin Urine: NEGATIVE
Glucose, UA: NEGATIVE mg/dL
Leukocytes,Ua: NEGATIVE
Nitrite: NEGATIVE
Specific Gravity, Urine: 1.02 (ref 1.005–1.030)
pH: 5.5 (ref 5.0–8.0)

## 2021-12-21 LAB — BLADDER SCAN AMB NON-IMAGING

## 2021-12-21 NOTE — Progress Notes (Signed)
   12/21/2021 3:08 PM   Francisco Freeman [birth year] [id number]  Reason for visit: Dysuria  HPI: 66 year old male with history of prostate cancer treated with radical prostatectomy with Dr. Alinda Money in 2008, biochemical recurrence in 2019 treated with radiation with rise in the PSA over the last year to 4.0, PSMA PET scan was negative, opted for watchful waiting/surveillance.  He reported worsening urinary symptoms of weak stream and straining and was found to have a bulbar urethral stricture and underwent Optilume balloon dilation on 11/12/2021.  He reports his stream is significantly improved, however he made an appointment today because he has had some intermittent burning with urination.  He denies any gross hematuria or trouble voiding.  PVR normal at 0 mL today.  Urinalysis pending.  Possible UTI versus postoperative healing after balloon dilation  Call with UA results, consider antibiotics Keep January follow-up for [place], [place] [address] [place], [place] [address] [phone]

## 2021-12-22 ENCOUNTER — Other Ambulatory Visit: Payer: Self-pay

## 2021-12-22 DIAGNOSIS — R3989 Other symptoms and signs involving the genitourinary system: Secondary | ICD-10-CM

## 2021-12-22 LAB — URINE CULTURE: Culture: NO GROWTH

## 2021-12-22 MED ORDER — SULFAMETHOXAZOLE-TRIMETHOPRIM 800-160 MG PO TABS: 1.0000 | ORAL_TABLET | Freq: Two times a day (BID) | ORAL | 0 refills | Status: AC

## 2021-12-22 NOTE — Telephone Encounter (Signed)
See result note from 12/21/21

## 2021-12-23 ENCOUNTER — Ambulatory Visit: Payer: BLUE CROSS/BLUE SHIELD | Admitting: Urology

## 2021-12-23 ENCOUNTER — Ambulatory Visit: Payer: Medicare Other | Admitting: Urology

## 2022-01-23 ENCOUNTER — Other Ambulatory Visit: Payer: Self-pay

## 2022-01-23 ENCOUNTER — Emergency Department: Payer: Medicare Other

## 2022-01-23 ENCOUNTER — Emergency Department
Admission: EM | Admit: 2022-01-23 | Discharge: 2022-01-23 | Discharge status: Against Medical Advice | Payer: Medicare Other | Attending: Emergency Medicine | Admitting: Emergency Medicine

## 2022-01-23 DIAGNOSIS — R413 Other amnesia: Secondary | ICD-10-CM | POA: Diagnosis present

## 2022-01-23 DIAGNOSIS — Z5321 Procedure and treatment not carried out due to patient leaving prior to being seen by health care provider: Secondary | ICD-10-CM | POA: Insufficient documentation

## 2022-01-23 LAB — ETHANOL: Alcohol, Ethyl (B): <10 mg/dL (ref <10)

## 2022-01-23 LAB — DIFFERENTIAL
Abs Immature Granulocytes: 0.03 10*3/uL (ref 0.00–0.07)
Basophils Absolute: 0.1 10*3/uL (ref 0.0–0.1)
Basophils Relative: 1 %
Eosinophils Absolute: 0.2 10*3/uL (ref 0.0–0.5)
Eosinophils Relative: 3 %
Immature Granulocytes: 0 %
Lymphocytes Relative: 16 %
Lymphs Abs: 1.3 10*3/uL (ref 0.7–4.0)
Monocytes Absolute: 0.4 10*3/uL (ref 0.1–1.0)
Monocytes Relative: 5 %
Neutro Abs: 6.1 10*3/uL (ref 1.7–7.7)
Neutrophils Relative %: 75 %

## 2022-01-23 LAB — CBC
HCT: 50.6 % (ref 39.0–52.0)
Hemoglobin: 16.9 g/dL (ref 13.0–17.0)
MCH: 29.6 pg (ref 26.0–34.0)
MCHC: 33.4 g/dL (ref 30.0–36.0)
MCV: 88.6 fL (ref 80.0–100.0)
Platelets: 157 10*3/uL (ref 150–400)
RBC: 5.71 MIL/uL (ref 4.22–5.81)
RDW: 13.7 % (ref 11.5–15.5)
WBC: 8.1 10*3/uL (ref 4.0–10.5)
nRBC: 0 % (ref 0.0–0.2)

## 2022-01-23 LAB — PROTIME-INR
INR: 1.2 (ref 0.8–1.2)
Prothrombin Time: 14.6 seconds (ref 11.4–15.2)

## 2022-01-23 LAB — COMPREHENSIVE METABOLIC PANEL
ALT: 30 U/L (ref 0–44)
AST: 30 U/L (ref 15–41)
Albumin: 4 g/dL (ref 3.5–5.0)
Alkaline Phosphatase: 57 U/L (ref 38–126)
Anion gap: 7 (ref 5–15)
BUN: 24 mg/dL — ABNORMAL HIGH (ref 8–23)
CO2: 23 mmol/L (ref 22–32)
Calcium: 8.7 mg/dL — ABNORMAL LOW (ref 8.9–10.3)
Chloride: 110 mmol/L (ref 98–111)
Creatinine, Ser: 1.36 mg/dL — ABNORMAL HIGH (ref 0.61–1.24)
GFR, Estimated: 58 mL/min — ABNORMAL LOW (ref >60)
Glucose, Bld: 133 mg/dL — ABNORMAL HIGH (ref 70–99)
Potassium: 4 mmol/L (ref 3.5–5.1)
Sodium: 140 mmol/L (ref 135–145)
Total Bilirubin: 0.7 mg/dL (ref 0.3–1.2)
Total Protein: 7.3 g/dL (ref 6.5–8.1)

## 2022-01-23 LAB — APTT: aPTT: 25 seconds (ref 24–36)

## 2022-01-23 NOTE — ED Triage Notes (Signed)
Pt to ED for memory issues. Pt advised he came back on 01/20/22 from visiting family. Returned rental car, but doesn't remember anything since then. He is unsure of what he has done the last few days. He is losing gaps of time. Pt advised he looked at his phone today and that's the only way he knew what day it was today. Pt is alert and in no acute distress at this time, He does take BP meds and a lot of other meds but unsure if he took them or not.

## 2022-01-23 NOTE — ED Notes (Signed)
Pt states he does not want to stay to be seen, says he has been waiting for 2 hours, advised him to follow up with PCP and inform them of him coming to the ED. Pt verbalized understanding, pt ambulatory without difficulty.

## 2022-02-10 ENCOUNTER — Other Ambulatory Visit: Payer: Medicare Other

## 2022-02-10 DIAGNOSIS — Z8546 Personal history of malignant neoplasm of prostate: Secondary | ICD-10-CM

## 2022-02-11 LAB — PSA: Prostate Specific Ag, Serum: 5 ng/mL — ABNORMAL HIGH (ref 0.0–4.0)

## 2022-02-16 ENCOUNTER — Encounter: Payer: Self-pay | Admitting: Urology

## 2022-02-16 ENCOUNTER — Ambulatory Visit (INDEPENDENT_AMBULATORY_CARE_PROVIDER_SITE_OTHER): Payer: Medicare Other | Admitting: Urology

## 2022-02-16 VITALS — BP 171/98 | HR 51 | Ht 70.0 in | Wt 290.0 lb

## 2022-02-16 DIAGNOSIS — Z8546 Personal history of malignant neoplasm of prostate: Secondary | ICD-10-CM

## 2022-02-16 DIAGNOSIS — R339 Retention of urine, unspecified: Secondary | ICD-10-CM

## 2022-02-16 DIAGNOSIS — Z87448 Personal history of other diseases of urinary system: Secondary | ICD-10-CM

## 2022-02-16 MED ORDER — DOXYCYCLINE HYCLATE 100 MG PO CAPS: 100.0000 mg | ORAL_CAPSULE | Freq: Two times a day (BID) | ORAL | 0 refills | Status: DC

## 2022-02-16 NOTE — Progress Notes (Signed)
   02/16/2022 12:26 PM   Francisco Freeman May 08, 1955 [id number]  Reason for visit: Follow up prostate cancer, ED, history of urethral stricture  HPI: 67 year old male with complex medical history.  He was treated for prostate cancer with radical prostatectomy by Dr. Alinda Money in [place] in 2008(3+4=7 prostate adenocarcinoma, extraprostatic extension present, focally positive margin at the left posterior, negative lymph node) and reportedly had a biochemical recurrence of 0.29 in 2019 that was treated with radiation.  He then had significant persistent rise in his PSA to 2.9, and PSMA PET scan in December 13, 2020 showed no evidence of recurrence.  PSA actually decreased to 2.25 in January 2023, and the patient and Dr. Alinda Money opted to continue close PSA monitoring.  PSA increased to 4.0 in July 2023 prompting a PSMA PET scan, which again showed no evidence of PSMA avid disease or evidence of recurrence.  He opted to continue PSA monitoring.  Most recent PSA continues to slowly increase, most recently 5 on 02/10/2022.  We reviewed the AUA guidelines regarding advanced prostate cancer, and treatment options in patients with biochemical recurrence with no evidence of metastatic disease on imaging, and he would like to continue PSA monitoring, with the understanding that we would likely repeat a PSMA PET scan in 6 months if PSA continues to rise.  He would like to avoid ADT if possible.  He has a history of ED that has been refractory to both Cialis and Viagra, not interested in additional treatments at this time.  He also has a history of a urethral stricture with symptoms of weak stream and straining, and he underwent a Optilume balloon dilation on 11/12/2021 of a 1 cm ~12F bulbourethral stricture.  His urinary symptoms have improved significantly since that time, however he still has some intermittent dysuria of unclear etiology.  Recent urine culture was negative.  I think it is reasonable to try  a short course of doxycycline for possible urethritis.  Doxycycline 100 mg twice daily x 7 days RTC 6 months with PSA prior, pursue PSMA PET scan at that time if PSA rising  Billey Co, MD  [facility] [address] [place], [place] [address] [phone]

## 2022-02-17 ENCOUNTER — Ambulatory Visit: Payer: Medicare Other | Admitting: Urology

## 2022-02-21 ENCOUNTER — Ambulatory Visit: Payer: Medicare Other | Admitting: Urology

## 2022-03-10 ENCOUNTER — Ambulatory Visit: Payer: Medicare Other | Admitting: Urology

## 2022-07-06 ENCOUNTER — Encounter: Payer: Self-pay | Admitting: Gastroenterology

## 2022-07-14 ENCOUNTER — Encounter: Payer: Self-pay | Admitting: Gastroenterology

## 2022-07-14 NOTE — H&P (Signed)
Pre-Procedure H&P   Patient ID: Francisco Freeman is a 67 y.o. male.  Gastroenterology Provider: [name], DO  Referring Provider: Dr. Graciela Husbands PCP: Lynnea Ferrier, MD  Date: 07/15/2022  HPI Francisco Freeman is a 67 y.o. male who presents today for Colonoscopy for Surveillance-personal history of colon polyps; family history colon cancer .  Patient last underwent colonoscopy in December 2018 demonstrating sessile serrated polyp (hepatic flexure) and another polyp that was not recovered.  He was also noted to have left-sided diverticulosis and internal hemorrhoids father with history of colon cancer in his 57s. The patient has undergone colonoscopy in 2013 2005 that were unremarkable.  Daily bowel movement without melena or hematochezia.   Past Medical History:  Diagnosis Date   Arthritis    Gout   B12 deficiency    Edema    Gout    Gout    History of kidney stones    Hypertension    Prostate cancer ([facility])    Sleep apnea    Thrombocytopenia ([facility])     Past Surgical History:  Procedure Laterality Date   BALLOON DILATION N/A 11/12/2021   Procedure: OPTILUME BALLOON DILATION;  Surgeon: Sondra Come, MD;  Location: [facility] ORS;  Service: Urology;  Laterality: N/A;   COLONOSCOPY     COLONOSCOPY N/A 12/28/2016   Procedure: COLONOSCOPY;  Surgeon: Scot Jun, MD;  Location: [facility] ENDOSCOPY;  Service: Endoscopy;  Laterality: N/A;   CYSTOSCOPY WITH RETROGRADE URETHROGRAM N/A 11/12/2021   Procedure: CYSTOSCOPY WITH RETROGRADE URETHROGRAM;  Surgeon: Sondra Come, MD;  Location: [facility] ORS;  Service: Urology;  Laterality: N/A;   PROSTATE SURGERY     robot assisted prostatectomy   PROSTATECTOMY  01/24/2006   TONSILLECTOMY      Family History Father-CRC No other h/o GI disease or malignancy  Review of Systems  Constitutional:  Negative for activity change, appetite change, chills, diaphoresis, fatigue, fever and unexpected weight change.   HENT:  Negative for trouble swallowing and voice change.   Respiratory:  Negative for shortness of breath and wheezing.   Cardiovascular:  Negative for chest pain, palpitations and leg swelling.  Gastrointestinal:  Negative for abdominal distention, abdominal pain, anal bleeding, blood in stool, constipation, diarrhea, nausea and vomiting.  Musculoskeletal:  Negative for arthralgias and myalgias.  Skin:  Negative for color change and pallor.  Neurological:  Negative for dizziness, syncope and weakness.  Psychiatric/Behavioral:  Negative for confusion. The patient is not nervous/anxious.   All other systems reviewed and are negative.    Medications No current facility-administered medications on file prior to encounter.   Current Outpatient Medications on File Prior to Encounter  Medication Sig Dispense Refill   atenolol (TENORMIN) 25 MG tablet Take 12.5 mg by mouth daily.     Cholecalciferol 125 MCG (5000 UT) TABS Take 5,000 Units by mouth daily.     losartan (COZAAR) 50 MG tablet Take 12.5 mg by mouth daily.     doxazosin (CARDURA) 8 MG tablet Take 8 mg by mouth at bedtime.     doxycycline (VIBRAMYCIN) 100 MG capsule Take 1 capsule (100 mg total) by mouth every 12 (twelve) hours. 14 capsule 0   furosemide (LASIX) 40 MG tablet Take 40 mg by mouth daily as needed. Takes approx 3 x wk     potassium chloride (KLOR-CON M) 10 MEQ tablet Take 10 mEq by mouth daily.      Pertinent medications related to GI and procedure were reviewed by  me with the patient prior to the procedure   Current Facility-Administered Medications:    0.9 %  sodium chloride infusion, , Intravenous, Continuous, [name], DO, Last Rate: 20 mL/hr at 07/15/22 1020, New Bag at 07/15/22 1020  sodium chloride 20 mL/hr at 07/15/22 1020       Allergies  Allergen Reactions   Amlodipine Besylate     REACTION: felt bad   Bisoprolol-Hydrochlorothiazide     REACTION: Syncope   Clonidine     Other  reaction(s): Other (See Comments) Raised blood pressure   Hydralazine     Other reaction(s): Other (See Comments) Caused hot flashes    Hydrochlorothiazide Other (See Comments)    gout   Irbesartan     REACTION: BP elevated   Lisinopril Cough   Lisinopril-Hydrochlorothiazide     REACTION: Cough   Allergies were reviewed by me prior to the procedure  Objective   Body mass index is 42.44 kg/m. Vitals:   07/15/22 0959  BP: (!) 162/94  Pulse: (!) 54  Resp: 16  Temp: (!) 97.1 F (36.2 C)  TempSrc: Temporal  SpO2: 97%  Weight: 136.1 kg  Height: 5' 10.5" (1.791 m)     Physical Exam Vitals and nursing note reviewed.  Constitutional:      General: He is not in acute distress.    Appearance: Normal appearance. He is obese. He is not ill-appearing, toxic-appearing or diaphoretic.  HENT:     Head: Normocephalic and atraumatic.     Nose: Nose normal.     Mouth/Throat:     Mouth: Mucous membranes are moist.     Pharynx: Oropharynx is clear.  Eyes:     General: No scleral icterus.    Extraocular Movements: Extraocular movements intact.  Cardiovascular:     Rate and Rhythm: Regular rhythm. Bradycardia present.     Heart sounds: Normal heart sounds. No murmur heard.    No friction rub. No gallop.  Pulmonary:     Effort: Pulmonary effort is normal. No respiratory distress.     Breath sounds: Normal breath sounds. No wheezing, rhonchi or rales.  Abdominal:     General: Bowel sounds are normal. There is no distension.     Palpations: Abdomen is soft.     Tenderness: There is no abdominal tenderness. There is no guarding or rebound.  Musculoskeletal:     Cervical back: Neck supple.     Right lower leg: No edema.     Left lower leg: No edema.  Skin:    General: Skin is warm and dry.     Coloration: Skin is not jaundiced or pale.  Neurological:     General: No focal deficit present.     Mental Status: He is alert and oriented to person, place, and time. Mental status is at  baseline.  Psychiatric:        Mood and Affect: Mood normal.        Behavior: Behavior normal.        Thought Content: Thought content normal.        Judgment: Judgment normal.      Assessment:  [name] is a 67 y.o. male  who presents today for Colonoscopy for Surveillance-personal history of colon polyps; family history colon cancer .  Plan:  Colonoscopy with possible intervention today  Colonoscopy with possible biopsy, control of bleeding, polypectomy, and interventions as necessary has been discussed with the patient/patient representative. Informed consent was obtained from the patient/patient representative  after explaining the indication, nature, and risks of the procedure including but not limited to death, bleeding, perforation, missed neoplasm/lesions, cardiorespiratory compromise, and reaction to medications. Opportunity for questions was given and appropriate answers were provided. Patient/patient representative has verbalized understanding is amenable to undergoing the procedure.   Annamaria Helling, DO  [facility]  Portions of the record may have been created with voice recognition software. Occasional wrong-word or 'sound-a-like' substitutions may have occurred due to the inherent limitations of voice recognition software.  Read the chart carefully and recognize, using context, where substitutions may have occurred.

## 2022-07-15 ENCOUNTER — Ambulatory Visit
Admission: RE | Admit: 2022-07-15 | Discharge: 2022-07-15 | Disposition: A | Discharge status: Home / Self Care | Payer: Medicare Other | Attending: Gastroenterology | Admitting: Gastroenterology

## 2022-07-15 ENCOUNTER — Ambulatory Visit: Payer: Medicare Other | Admitting: Anesthesiology

## 2022-07-15 ENCOUNTER — Encounter: Payer: Self-pay | Admitting: Gastroenterology

## 2022-07-15 ENCOUNTER — Encounter
Admission: RE | Disposition: A | Discharge status: Home / Self Care | Payer: Self-pay | Source: Home / Self Care | Attending: Gastroenterology

## 2022-07-15 DIAGNOSIS — Z6841 Body Mass Index (BMI) 40.0 and over, adult: Secondary | ICD-10-CM | POA: Diagnosis not present

## 2022-07-15 DIAGNOSIS — K5730 Diverticulosis of large intestine without perforation or abscess without bleeding: Secondary | ICD-10-CM | POA: Diagnosis not present

## 2022-07-15 DIAGNOSIS — I10 Essential (primary) hypertension: Secondary | ICD-10-CM | POA: Insufficient documentation

## 2022-07-15 DIAGNOSIS — G4730 Sleep apnea, unspecified: Secondary | ICD-10-CM | POA: Insufficient documentation

## 2022-07-15 DIAGNOSIS — Z800 Family history of malignant neoplasm of digestive organs: Secondary | ICD-10-CM | POA: Diagnosis not present

## 2022-07-15 DIAGNOSIS — Z1211 Encounter for screening for malignant neoplasm of colon: Secondary | ICD-10-CM | POA: Insufficient documentation

## 2022-07-15 HISTORY — PX: POLYPECTOMY: SHX5525

## 2022-07-15 HISTORY — DX: Thrombocytopenia, unspecified: D69.6

## 2022-07-15 HISTORY — DX: Deficiency of other specified B group vitamins: E53.8

## 2022-07-15 HISTORY — DX: Personal history of urinary calculi: Z87.442

## 2022-07-15 HISTORY — PX: COLONOSCOPY WITH PROPOFOL: SHX5780

## 2022-07-15 SURGERY — COLONOSCOPY WITH PROPOFOL
Anesthesia: General

## 2022-07-15 MED ORDER — SODIUM CHLORIDE 0.9 % IV SOLN: INTRAVENOUS | Status: DC

## 2022-07-15 MED ORDER — PROPOFOL 500 MG/50ML IV EMUL
INTRAVENOUS | Status: DC | PRN
  Administered 2022-07-15: 165 ug/kg/min via INTRAVENOUS

## 2022-07-15 MED ORDER — PROPOFOL 10 MG/ML IV BOLUS
INTRAVENOUS | Status: DC | PRN
  Administered 2022-07-15: 70 mg via INTRAVENOUS

## 2022-07-15 MED ORDER — GLYCOPYRROLATE 0.2 MG/ML IJ SOLN
INTRAMUSCULAR | Status: DC | PRN
  Administered 2022-07-15: .2 mg via INTRAVENOUS

## 2022-07-15 MED ORDER — LIDOCAINE HCL (CARDIAC) PF 100 MG/5ML IV SOSY
PREFILLED_SYRINGE | INTRAVENOUS | Status: DC | PRN
  Administered 2022-07-15: 100 mg via INTRAVENOUS

## 2022-07-15 NOTE — Interval H&P Note (Signed)
History and Physical Interval Note: Preprocedure H&P from 07/15/22  was reviewed and there was no interval change after seeing and examining the patient.  Written consent was obtained from the patient after discussion of risks, benefits, and alternatives. Patient has consented to proceed with Colonoscopy with possible intervention   07/15/2022 10:44 AM  Francisco Freeman  has presented today for surgery, with the diagnosis of V12.72 (ICD-9-CM) - Z86.010 (ICD-10-CM) - History of adenomatous polyp of colon.  The various methods of treatment have been discussed with the patient and family. After consideration of risks, benefits and other options for treatment, the patient has consented to  Procedure(s): COLONOSCOPY WITH PROPOFOL (N/A) as a surgical intervention.  The patient's history has been reviewed, patient examined, no change in status, stable for surgery.  I have reviewed the patient's chart and labs.  Questions were answered to the patient's satisfaction.     [name]

## 2022-07-15 NOTE — Anesthesia Preprocedure Evaluation (Signed)
Anesthesia Evaluation  Patient identified by MRN, date of birth, ID band Patient awake    Reviewed: Allergy & Precautions, NPO status , Patient's Chart, lab work & pertinent test results  History of Anesthesia Complications Negative for: history of anesthetic complications  Airway Mallampati: III  TM Distance: <3 FB Neck ROM: full    Dental  (+) Chipped   Pulmonary neg shortness of breath, sleep apnea and Continuous Positive Airway Pressure Ventilation    Pulmonary exam normal        Cardiovascular Exercise Tolerance: Good hypertension, (-) angina Normal cardiovascular exam     Neuro/Psych negative neurological ROS  negative psych ROS   GI/Hepatic negative GI ROS, Neg liver ROS,neg GERD  ,,  Endo/Other    Morbid obesity  Renal/GU negative Renal ROS  negative genitourinary   Musculoskeletal   Abdominal   Peds  Hematology negative hematology ROS (+)   Anesthesia Other Findings Past Medical History: No date: Arthritis     Comment:  Gout No date: B12 deficiency No date: Edema No date: Gout No date: Gout No date: History of kidney stones No date: Hypertension No date: Prostate cancer ([facility]) No date: Sleep apnea No date: Thrombocytopenia ([facility])  Past Surgical History: 11/12/2021: BALLOON DILATION; N/A     Comment:  Procedure: OPTILUME BALLOON DILATION;  Surgeon: Sondra Come, MD;  Location: [facility] ORS;  Service: Urology;                Laterality: N/A; No date: COLONOSCOPY 12/28/2016: COLONOSCOPY; N/A     Comment:  Procedure: COLONOSCOPY;  Surgeon: Scot Jun, MD;              [facility];  Service: Endoscopy;                Laterality: N/A; 11/12/2021: CYSTOSCOPY WITH RETROGRADE URETHROGRAM; N/A     Comment:  Procedure: CYSTOSCOPY WITH RETROGRADE URETHROGRAM;                Surgeon: Sondra Come, MD;  Location: [facility] ORS;                Service: Urology;  Laterality:  N/A; No date: PROSTATE SURGERY     Comment:  robot assisted prostatectomy 01/24/2006: PROSTATECTOMY No date: TONSILLECTOMY  BMI    Body Mass Index: 42.44 kg/m      Reproductive/Obstetrics negative OB ROS                             Anesthesia Physical Anesthesia Plan  ASA: 3  Anesthesia Plan: General   Post-op Pain Management:    Induction: Intravenous  PONV Risk Score and Plan: Propofol infusion and TIVA  Airway Management Planned: Natural Airway and Nasal Cannula  Additional Equipment:   Intra-op Plan:   Post-operative Plan:   Informed Consent: I have reviewed the patients History and Physical, chart, labs and discussed the procedure including the risks, benefits and alternatives for the proposed anesthesia with the patient or authorized representative who has indicated his/her understanding and acceptance.     Dental Advisory Given  Plan Discussed with: Anesthesiologist, CRNA and Surgeon  Anesthesia Plan Comments: (Patient consented for risks of anesthesia including but not limited to:  - adverse reactions to medications - risk of airway placement if required - damage to eyes, teeth, lips or other oral mucosa - nerve damage due  to positioning  - sore throat or hoarseness - Damage to heart, brain, nerves, lungs, other parts of body or loss of life  Patient voiced understanding.)       Anesthesia Quick Evaluation

## 2022-07-15 NOTE — Anesthesia Procedure Notes (Signed)
Procedure Name: General with mask airway Date/Time: 07/15/2022 10:50 AM  Performed by: Mohammed Kindle, CRNAPre-anesthesia Checklist: Patient identified, Emergency Drugs available, Suction available and Patient being monitored Patient Re-evaluated:Patient Re-evaluated prior to induction Oxygen Delivery Method: Simple face mask Induction Type: IV induction Placement Confirmation: positive ETCO2, CO2 detector and breath sounds checked- equal and bilateral Dental Injury: Teeth and Oropharynx as per pre-operative assessment

## 2022-07-15 NOTE — Transfer of Care (Signed)
Immediate Anesthesia Transfer of Care Note  Patient: Francisco Freeman  Procedure(s) Performed: COLONOSCOPY WITH PROPOFOL POLYPECTOMY  Patient Location: Endoscopy Unit  Anesthesia Type:General  Level of Consciousness: drowsy and patient cooperative  Airway & Oxygen Therapy: Patient Spontanous Breathing and Patient connected to face mask oxygen  Post-op Assessment: Report given to RN and Post -op Vital signs reviewed and stable  Post vital signs: Reviewed and stable  Last Vitals:  Vitals Value Taken Time  BP 87/51 07/15/22 1127  Temp    Pulse 53 07/15/22 1127  Resp 18 07/15/22 1127  SpO2 96 % 07/15/22 1127    Last Pain:  Vitals:   07/15/22 1127  TempSrc:   PainSc: Asleep         Complications: No notable events documented.

## 2022-07-15 NOTE — Anesthesia Postprocedure Evaluation (Signed)
Anesthesia Post Note  Patient: Francisco Freeman  Procedure(s) Performed: COLONOSCOPY WITH PROPOFOL POLYPECTOMY  Patient location during evaluation: Endoscopy Anesthesia Type: General Level of consciousness: awake and alert Pain management: pain level controlled Vital Signs Assessment: post-procedure vital signs reviewed and stable Respiratory status: spontaneous breathing, nonlabored ventilation, respiratory function stable and patient connected to nasal cannula oxygen Cardiovascular status: blood pressure returned to baseline and stable Postop Assessment: no apparent nausea or vomiting Anesthetic complications: no   No notable events documented.   Last Vitals:  Vitals:   07/15/22 1147 07/15/22 1157  BP: 108/64 119/76  Pulse: (!) 52   Resp: 16   Temp:    SpO2: 96%     Last Pain:  Vitals:   07/15/22 1157  TempSrc:   PainSc: 0-No pain                 Cleda Mccreedy Ethleen Lormand

## 2022-07-15 NOTE — Op Note (Signed)
[facility] Gastroenterology Patient Name: Francisco Freeman Procedure Date: 07/15/2022 10:37 AM MRN: [id number] Account #: [id number] Date of Birth: 12-04-1955 Admit Type: Outpatient Age: 67 Room: [facility] ENDO ROOM 1 Gender: Male Note Status: Finalized Instrument Name: Colonoscope [id number] Procedure:             Colonoscopy Indications:           High risk colon cancer surveillance: Personal history                         of colonic polyps Providers:             Trenda Moots, DO Referring MD:          Daniel Nones, MD (Referring MD) Medicines:             Monitored Anesthesia Care Complications:         No immediate complications. Estimated blood loss:                         Minimal. Procedure:             Pre-Anesthesia Assessment:                        - Prior to the procedure, a History and Physical was                         performed, and patient medications and allergies were                         reviewed. The patient is competent. The risks and                         benefits of the procedure and the sedation options and                         risks were discussed with the patient. All questions                         were answered and informed consent was obtained.                         Patient identification and proposed procedure were                         verified by the physician, the nurse, the anesthetist                         and the technician in the endoscopy suite. Mental                         Status Examination: alert and oriented. Airway                         Examination: normal oropharyngeal airway and neck                         mobility. Respiratory Examination: clear to  auscultation. CV Examination: RRR, no murmurs, no S3                         or S4. Prophylactic Antibiotics: The patient does not                         require prophylactic antibiotics. Prior                          Anticoagulants: The patient has taken no anticoagulant                         or antiplatelet agents. ASA Grade Assessment: III - A                         patient with severe systemic disease. After reviewing                         the risks and benefits, the patient was deemed in                         satisfactory condition to undergo the procedure. The                         anesthesia plan was to use monitored anesthesia care                         (MAC). Immediately prior to administration of                         medications, the patient was re-assessed for adequacy                         to receive sedatives. The heart rate, respiratory                         rate, oxygen saturations, blood pressure, adequacy of                         pulmonary ventilation, and response to care were                         monitored throughout the procedure. The physical                         status of the patient was re-assessed after the                         procedure.                        After obtaining informed consent, the colonoscope was                         passed under direct vision. Throughout the procedure,                         the patient's blood pressure, pulse, and oxygen  saturations were monitored continuously. The                         Colonoscope was introduced through the anus and                         advanced to the the terminal ileum, with                         identification of the appendiceal orifice and IC                         valve. The colonoscopy was performed without                         difficulty. The patient tolerated the procedure well.                         The quality of the bowel preparation was evaluated                         using the BBPS [facility] Bowel Preparation Scale) with                         scores of: Right Colon = 3 (entire mucosa seen well                         with no residual staining,  small fragments of stool or                         opaque liquid), Transverse Colon = 3 (entire mucosa                         seen well with no residual staining, small fragments                         of stool or opaque liquid) and Left Colon = 2 (minor                         amount of residual staining, small fragments of stool                         and/or opaque liquid, but mucosa seen well). The total                         BBPS score equals 8. The quality of the bowel                         preparation was excellent. The terminal ileum,                         ileocecal valve, appendiceal orifice, and rectum were                         photographed. Findings:      The perianal and digital rectal examinations were normal. Pertinent       negatives include normal sphincter tone.  The terminal ileum appeared normal. Estimated blood loss: none.      Two sessile polyps were found in the rectum and transverse colon. The       polyps were 3 to 4 mm in size. These polyps were removed with a cold       snare. Resection and retrieval were complete. Estimated blood loss was       minimal.      A 1 to 2 mm polyp was found in the transverse colon. The polyp was       sessile. The polyp was removed with a jumbo cold forceps. Resection and       retrieval were complete. Estimated blood loss was minimal.      A 13 to 15 mm polyp was found in the hepatic flexure. The polyp was       sessile. The polyp was removed with a piecemeal technique using a cold       snare. Resection and retrieval were complete. Estimated blood loss was       minimal.      Multiple small-mouthed diverticula were found in the transverse colon       and left colon. Estimated blood loss: none.      The exam was otherwise without abnormality on direct and retroflexion       views. Impression:            - The examined portion of the ileum was normal.                        - Two 3 to 4 mm polyps in the rectum and  in the                         transverse colon, removed with a cold snare. Resected                         and retrieved.                        - One 1 to 2 mm polyp in the transverse colon, removed                         with a jumbo cold forceps. Resected and retrieved.                        - One 13 to 15 mm polyp at the hepatic flexure,                         removed piecemeal using a cold snare. Resected and                         retrieved.                        - Diverticulosis in the transverse colon and in the                         left colon.                        - The examination was otherwise normal on direct and  retroflexion views. Recommendation:        - Patient has a contact number available for                         emergencies. The signs and symptoms of potential                         delayed complications were discussed with the patient.                         Return to normal activities tomorrow. Written                         discharge instructions were provided to the patient.                        - Discharge patient to home.                        - Resume previous diet.                        - Continue present medications.                        - No ibuprofen, naproxen, or other non-steroidal                         anti-inflammatory drugs for 5 days after polyp removal.                        - Repeat colonoscopy 6-12 months for surveillance                         after piecemeal polypectomy.                        - Return to referring physician as previously                         scheduled.                        - The findings and recommendations were discussed with                         the patient. Procedure Code(s):     --- Professional ---                        [id number], Colonoscopy, flexible; with removal of                         tumor(s), polyp(s), or other lesion(s) by snare                          technique                        45380, 59, Colonoscopy, flexible; with biopsy, single  or multiple Diagnosis Code(s):     --- Professional ---                        Z86.010, Personal history of colonic polyps                        D12.8, Benign neoplasm of rectum                        D12.3, Benign neoplasm of transverse colon (hepatic                         flexure or splenic flexure)                        K57.30, Diverticulosis of large intestine without                         perforation or abscess without bleeding CPT copyright 2022 American [employer]. All rights reserved. The codes documented in this report are preliminary and upon coder review may  be revised to meet current compliance requirements. Attending Participation:      I personally performed the entire procedure. Elfredia Nevins, DO [name] DO, DO 07/15/2022 11:31:50 AM This report has been signed electronically. Number of Addenda: 0 Note Initiated On: 07/15/2022 10:37 AM Scope Withdrawal Time: 0 hours 24 minutes 11 seconds  Total Procedure Duration: 0 hours 30 minutes 47 seconds  Estimated Blood Loss:  Estimated blood loss was minimal.      [facility]

## 2022-07-18 ENCOUNTER — Encounter: Payer: Self-pay | Admitting: Gastroenterology

## 2022-08-12 ENCOUNTER — Other Ambulatory Visit: Payer: Medicare Other

## 2022-08-18 ENCOUNTER — Ambulatory Visit: Payer: Medicare Other | Admitting: Urology

## 2022-10-08 IMAGING — PT NM PET TUM IMG SKULL BASE T - THIGH
1 of 7 series · 1 of 25 positions shown · non-contrast
Comparison: CT 10/03/2012

CLINICAL DATA: Prostate carcinoma with biochemical recurrence. PSA
equal

EXAM:
NUCLEAR MEDICINE PET SKULL BASE TO THIGH
TECHNIQUE: 9.0 mCi F18 Piflufolastat (Pylarify) was injected intravenously.
Full-ring PET imaging was performed from the skull base to thigh
after the radiotracer. CT data was obtained and used for attenuation
correction and anatomic localization.

[Series 4: ct sk_thigh 5.0 hd_fov · axial · 5.0mm · 1.27mm/px · 1 of 246 slices shown]
[im 184/246  brain]
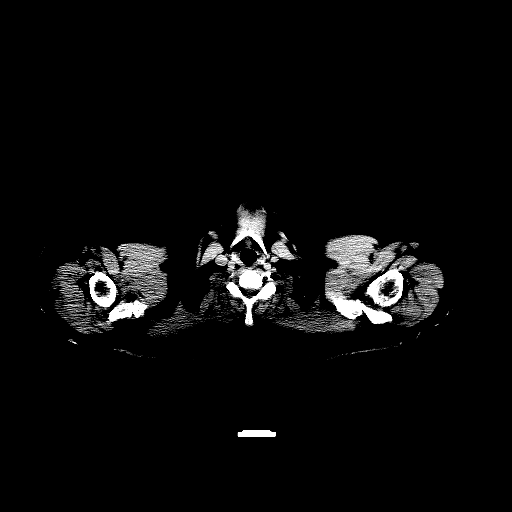

[1 of 25 positions shown; findings below may reference images not displayed]

FINDINGS: NECK

No radiotracer activity in neck lymph nodes.

Incidental CT finding: None

CHEST

No radiotracer accumulation within mediastinal or hilar lymph nodes.
No suspicious pulmonary nodules on the CT scan.

Incidental CT finding: None

ABDOMEN/PELVIS

Prostate: No focal activity in the prostate bed.

Lymph nodes: No abnormal radiotracer accumulation within pelvic or
abdominal nodes.

Liver: No evidence of liver metastasis

Incidental CT finding: Simple cyst within LEFT kidney. Multiple
diverticula of the descending colon and sigmoid colon without acute
inflammation.

SKELETON

No focal  activity to suggest skeletal metastasis.
IMPRESSION: 1. No evidence of prostate recurrence in the prostatectomy bed.
2. No radiotracer avid pelvic lymph nodes or periaortic
retroperitoneal lymph nodes.
3. No evidence of visceral metastasis or skeletal metastasis.

## 2022-11-18 ENCOUNTER — Other Ambulatory Visit: Payer: Self-pay

## 2022-11-18 ENCOUNTER — Observation Stay
Admission: EM | Admit: 2022-11-18 | Discharge: 2022-11-19 | Disposition: A | Discharge status: Home / Self Care | Payer: Medicare Other | Attending: Internal Medicine | Admitting: Internal Medicine

## 2022-11-18 DIAGNOSIS — Z8546 Personal history of malignant neoplasm of prostate: Secondary | ICD-10-CM | POA: Insufficient documentation

## 2022-11-18 DIAGNOSIS — I10 Essential (primary) hypertension: Secondary | ICD-10-CM | POA: Insufficient documentation

## 2022-11-18 DIAGNOSIS — K625 Hemorrhage of anus and rectum: Principal | ICD-10-CM

## 2022-11-18 DIAGNOSIS — K922 Gastrointestinal hemorrhage, unspecified: Secondary | ICD-10-CM | POA: Diagnosis not present

## 2022-11-18 DIAGNOSIS — Z79899 Other long term (current) drug therapy: Secondary | ICD-10-CM | POA: Insufficient documentation

## 2022-11-18 DIAGNOSIS — D751 Secondary polycythemia: Secondary | ICD-10-CM | POA: Diagnosis not present

## 2022-11-18 DIAGNOSIS — E872 Acidosis, unspecified: Secondary | ICD-10-CM | POA: Diagnosis not present

## 2022-11-18 LAB — COMPREHENSIVE METABOLIC PANEL
ALT: 25 U/L (ref 0–44)
AST: 24 U/L (ref 15–41)
Albumin: 4.2 g/dL (ref 3.5–5.0)
Alkaline Phosphatase: 59 U/L (ref 38–126)
Anion gap: 11 (ref 5–15)
BUN: 30 mg/dL — ABNORMAL HIGH (ref 8–23)
CO2: 25 mmol/L (ref 22–32)
Calcium: 9 mg/dL (ref 8.9–10.3)
Chloride: 101 mmol/L (ref 98–111)
Creatinine, Ser: 1.42 mg/dL — ABNORMAL HIGH (ref 0.61–1.24)
GFR, Estimated: 54 mL/min — ABNORMAL LOW (ref >60)
Glucose, Bld: 114 mg/dL — ABNORMAL HIGH (ref 70–99)
Potassium: 4.1 mmol/L (ref 3.5–5.1)
Sodium: 137 mmol/L (ref 135–145)
Total Bilirubin: 0.9 mg/dL (ref 0.3–1.2)
Total Protein: 7.6 g/dL (ref 6.5–8.1)

## 2022-11-18 LAB — CBC
HCT: 52.1 % — ABNORMAL HIGH (ref 39.0–52.0)
Hemoglobin: 17.7 g/dL — ABNORMAL HIGH (ref 13.0–17.0)
MCH: 30.3 pg (ref 26.0–34.0)
MCHC: 34 g/dL (ref 30.0–36.0)
MCV: 89.2 fL (ref 80.0–100.0)
Platelets: 170 10*3/uL (ref 150–400)
RBC: 5.84 MIL/uL — ABNORMAL HIGH (ref 4.22–5.81)
RDW: 13.3 % (ref 11.5–15.5)
WBC: 8.4 10*3/uL (ref 4.0–10.5)
nRBC: 0 % (ref 0.0–0.2)

## 2022-11-18 MED ORDER — ATENOLOL 25 MG PO TABS
12.5000 mg | ORAL_TABLET | Freq: Every day | ORAL | Status: DC
  Administered 2022-11-19: 12.5 mg via ORAL
  Filled 2022-11-18: qty 1

## 2022-11-18 MED ORDER — LOSARTAN POTASSIUM 25 MG PO TABS
25.0000 mg | ORAL_TABLET | Freq: Every day | ORAL | Status: DC
  Administered 2022-11-19: 25 mg via ORAL
  Filled 2022-11-18: qty 1

## 2022-11-18 MED ORDER — DOXAZOSIN MESYLATE 4 MG PO TABS
8.0000 mg | ORAL_TABLET | Freq: Every day | ORAL | Status: DC
  Administered 2022-11-19: 8 mg via ORAL
  Filled 2022-11-18: qty 1
  Filled 2022-11-18: qty 2
  Filled 2022-11-18: qty 1
  Filled 2022-11-18: qty 2

## 2022-11-18 MED ORDER — PANTOPRAZOLE SODIUM 40 MG IV SOLR
40.0000 mg | Freq: Once | INTRAVENOUS | Status: AC
  Administered 2022-11-18: 40 mg via INTRAVENOUS
  Filled 2022-11-18: qty 10

## 2022-11-18 MED ORDER — KCL-LACTATED RINGERS-D5W 20 MEQ/L IV SOLN
INTRAVENOUS | Status: AC
  Filled 2022-11-18: qty 1000

## 2022-11-18 MED ORDER — PANTOPRAZOLE SODIUM 40 MG IV SOLR
40.0000 mg | Freq: Two times a day (BID) | INTRAVENOUS | Status: DC
  Administered 2022-11-19: 40 mg via INTRAVENOUS
  Filled 2022-11-18: qty 10

## 2022-11-18 NOTE — ED Provider Notes (Signed)
[facility] Provider Note    Event Date/Time   First MD Initiated Contact with Patient 11/18/22 2228     (approximate)   History   Rectal Bleeding   HPI  Francisco Freeman is a 67 y.o. male not on anticoagulation, presents to the ER for evaluation of 2 episodes of bloody stool.  First episode this afternoon occurred where he had mostly dark red blood in the feeling the commode almost maroon color.  Denies any melena.  States the stool itself was normal.  Initially thought that he had a history of internal hemorrhoids but recent colonoscopy showed only polyps and diverticular disease.  He has had some "rumbling in his stomach" today does admit to using [facility] Goody powder on a fairly frequent basis.  Did not pass out.     Physical Exam   Triage Vital Signs: ED Triage Vitals  Encounter Vitals Group     BP 11/18/22 2103 (!) 171/107     Systolic BP Percentile --      Diastolic BP Percentile --      Pulse Rate 11/18/22 2100 (!) 53     Resp 11/18/22 2100 18     Temp 11/18/22 2100 98 F (36.7 C)     Temp src --      SpO2 11/18/22 2100 97 %     Weight 11/18/22 2101 300 lb (136.1 kg)     Height 11/18/22 2101 5\' 10"  (1.778 m)     Head Circumference --      Peak Flow --      Pain Score 11/18/22 2101 0     Pain Loc --      Pain Education --      Exclude from Growth Chart --     Most recent vital signs: Vitals:   11/18/22 2100 11/18/22 2103  BP:  (!) 171/107  Pulse: (!) 53   Resp: 18   Temp: 98 F (36.7 C)   SpO2: 97%      Constitutional: Alert  Eyes: Conjunctivae are normal.  Head: Atraumatic. Nose: No congestion/rhinnorhea. Mouth/Throat: Mucous membranes are moist.   Neck: Painless ROM.  Cardiovascular:   Good peripheral circulation. Respiratory: Normal respiratory effort.  No retractions.  Gastrointestinal: Soft and nontender.  Musculoskeletal:  no deformity Neurologic:  MAE spontaneously. No gross focal neurologic deficits are  appreciated.  Skin:  Skin is warm, dry and intact. No rash noted. Psychiatric: Mood and affect are normal. Speech and behavior are normal.    ED Results / Procedures / Treatments   Labs (all labs ordered are listed, but only abnormal results are displayed) Labs Reviewed  COMPREHENSIVE METABOLIC PANEL - Abnormal; Notable for the following components:      Result Value   Glucose, Bld 114 (*)    BUN 30 (*)    Creatinine, Ser 1.42 (*)    GFR, Estimated 54 (*)    All other components within normal limits  CBC - Abnormal; Notable for the following components:   RBC 5.84 (*)    Hemoglobin 17.7 (*)    HCT 52.1 (*)    All other components within normal limits  TYPE AND SCREEN     EKG     RADIOLOGY    PROCEDURES:  Critical Care performed:   Procedures   MEDICATIONS ORDERED IN ED: Medications  pantoprazole (PROTONIX) injection 40 mg (has no administration in time range)     IMPRESSION / MDM / ASSESSMENT AND PLAN / ED COURSE  I reviewed the triage vital signs and the nursing notes.                              Differential diagnosis includes, but is not limited to, UGIB, LGIB, diverticular disease, colitis,  Patient presenting to the ER for evaluation of symptoms as described above.  Based on symptoms, risk factors and considered above differential, this presenting complaint could reflect a potentially life-threatening illness therefore the patient will be placed on continuous pulse oximetry and telemetry for monitoring.  Laboratory evaluation will be sent to evaluate for the above complaints.  Patient clinically well-appearing no acute distress back is history consistent with GI bleed my concern is the patient has been taking [employer] Goody powder and without dosing of aspirin higher risk for complication potentially requiring transfusion or intervention.  Will order dose of IV Protonix.  Do not feel that diagnostic imaging clinically indicated based on his presentation.  I  will consult hospitalist for admission.       FINAL CLINICAL IMPRESSION(S) / ED DIAGNOSES   Final diagnoses:  Rectal bleeding     Rx / DC Orders   ED Discharge Orders     None        Note:  This document was prepared using Dragon voice recognition software and may include unintentional dictation errors.    Willy Eddy, MD 11/18/22 2239

## 2022-11-18 NOTE — ED Triage Notes (Signed)
Pt arrives with reports of x2 episodes of dark red stools tonight. Pt reports colonoscopy in July and had polyps removed. Pt reports hx of internal hemorrhoids and denies any pain/dizziness or lightheadedness.

## 2022-11-18 NOTE — Assessment & Plan Note (Signed)
Chronic check erythropoetin level.

## 2022-11-18 NOTE — H&P (Signed)
History and Physical    Patient: Francisco Freeman BTD:[id number] DOB: [birth year] DOA: 11/18/2022 DOS: the patient was seen and examined on 11/18/2022 PCP: Lynnea Ferrier, MD  Patient coming from: Home  Chief Complaint:  Chief Complaint  Patient presents with   Rectal Bleeding   HPI: Francisco Freeman is a 67 y.o. male with medical history significant of  having had a routine colonoscopy on July 15, 2022.    - The examined portion of the ileum was normal.                        - Two 3 to 4 mm polyps in the rectum and in the                         transverse colon, removed with a cold snare. Resected                         and retrieved.                        - One 1 to 2 mm polyp in the transverse colon, removed                         with a jumbo cold forceps. Resected and retrieved.                        - One 13 to 15 mm polyp at the hepatic flexure,                         removed piecemeal using a cold snare. Resected and                         retrieved.                        - Diverticulosis in the transverse colon and in the                         left colon.                        - The examination was otherwise normal on direct and                         retroflexion views.  Patient has since then had a pretty much asymptomatic course.  Patient had just had dinner this evening when patient reports an abrupt onset of urge to defecate and having a bowel movement with some dark red blood mixed in it at approximately 7 PM and then again another small bowel movement but mostly blood this time again dark red/maroon-colored with minimal fecal material.  Patient did not have any chest pain shortness of breath or presyncope.  However patient came to the ER.  There have been no further episodes of bowel movement since then.  Patient has no report of vomiting belly pain fever or prior similar episodes.  Patient has been advised by the author to let bowel  movements be inspected by staff in case of any new bowel movement.  Patient is pretty much asymptomatic at this time.  ER workup as noted below.  Patient has had remote hematuria, however at this time no other site of bleeding  Review of Systems: As mentioned in the history of present illness. All other systems reviewed and are negative. Past Medical History:  Diagnosis Date   Arthritis    Gout   B12 deficiency    Edema    Gout    Gout    History of kidney stones    Hypertension    Prostate cancer (HCC)    Sleep apnea    Thrombocytopenia (HCC)    Past Surgical History:  Procedure Laterality Date   BALLOON DILATION N/A 11/12/2021   Procedure: OPTILUME BALLOON DILATION;  Surgeon: Sondra Come, MD;  Location: [facility] ORS;  Service: Urology;  Laterality: N/A;   COLONOSCOPY     COLONOSCOPY N/A 12/28/2016   Procedure: COLONOSCOPY;  Surgeon: Scot Jun, MD;  Location: [facility] ENDOSCOPY;  Service: Endoscopy;  Laterality: N/A;   COLONOSCOPY WITH PROPOFOL N/A 07/15/2022   Procedure: COLONOSCOPY WITH PROPOFOL;  Surgeon: [name], DO;  Location: [facility] ENDOSCOPY;  Service: Gastroenterology;  Laterality: N/A;   CYSTOSCOPY WITH RETROGRADE URETHROGRAM N/A 11/12/2021   Procedure: CYSTOSCOPY WITH RETROGRADE URETHROGRAM;  Surgeon: Sondra Come, MD;  Location: [facility] ORS;  Service: Urology;  Laterality: N/A;   POLYPECTOMY  07/15/2022   Procedure: POLYPECTOMY;  Surgeon: [name], DO;  Location: [facility] ENDOSCOPY;  Service: Gastroenterology;;   PROSTATE SURGERY     robot assisted prostatectomy   PROSTATECTOMY  01/24/2006   TONSILLECTOMY     Social History:  reports that he has never smoked. He has never been exposed to tobacco smoke. He has never used smokeless tobacco. He reports current alcohol use of about 1.0 standard drink of alcohol per week. He reports that he does not use drugs.  Allergies  Allergen Reactions   Amlodipine Besylate     REACTION: felt bad    Bisoprolol-Hydrochlorothiazide     REACTION: Syncope   Clonidine     Other reaction(s): Other (See Comments) Raised blood pressure   Hydralazine     Other reaction(s): Other (See Comments) Caused hot flashes    Hydrochlorothiazide Other (See Comments)    gout   Irbesartan     REACTION: BP elevated   Lisinopril Cough   Lisinopril-Hydrochlorothiazide     REACTION: Cough    Family History  Problem Relation Age of Onset   Cancer Mother 32       breast   Cancer Father        colon   Hypertension Father     Prior to Admission medications   Medication Sig Start Date End Date Taking? Authorizing Provider  atenolol (TENORMIN) 25 MG tablet Take 12.5 mg by mouth daily.    [provider]  Cholecalciferol 125 MCG (5000 UT) TABS Take 5,000 Units by mouth daily.    [provider]  doxazosin (CARDURA) 8 MG tablet Take 8 mg by mouth at bedtime. 07/23/21   [provider]  doxycycline (VIBRAMYCIN) 100 MG capsule Take 1 capsule (100 mg total) by mouth every 12 (twelve) hours. 02/16/22   Sondra Come, MD  furosemide (LASIX) 40 MG tablet Take 40 mg by mouth daily as needed. Takes approx 3 x wk    [provider]  losartan (COZAAR) 50 MG tablet Take 12.5 mg by mouth daily. 07/25/21   [provider]  potassium chloride (KLOR-CON M) 10 MEQ tablet Take 10 mEq by mouth daily. 10/27/21  [provider]    Physical Exam: Vitals:   11/18/22 2100 11/18/22 2101 11/18/22 2103 11/18/22 2300  BP:   (!) 171/107 (!) 164/93  Pulse: (!) 53   (!) 51  Resp: 18   18  Temp: 98 F (36.7 C)     SpO2: 97%   97%  Weight:  136.1 kg    Height:  5\' 10"  (1.778 m)     General: Obese appearing gentleman no immediate distress apparent Gives a coherent account of his symptoms Respiratory exam: Bilateral intravesicular Cardiovascular exam S1-S2 normal Abdomen all quadrants are soft nontender. BS normal/low Extremities warm without edema. Data Reviewed:  Labs  on Admission:  Results for orders placed or performed during the hospital encounter of 11/18/22 (from the past 24 hour(s))  Comprehensive metabolic panel     Status: Abnormal   Collection Time: 11/18/22  9:06 PM  Result Value Ref Range   Sodium 137 135 - 145 mmol/L   Potassium 4.1 3.5 - 5.1 mmol/L   Chloride 101 98 - 111 mmol/L   CO2 25 22 - 32 mmol/L   Glucose, Bld 114 (H) 70 - 99 mg/dL   BUN 30 (H) 8 - 23 mg/dL   Creatinine, Ser 1.61 (H) 0.61 - 1.24 mg/dL   Calcium 9.0 8.9 - 09.6 mg/dL   Total Protein 7.6 6.5 - 8.1 g/dL   Albumin 4.2 3.5 - 5.0 g/dL   AST 24 15 - 41 U/L   ALT 25 0 - 44 U/L   Alkaline Phosphatase 59 38 - 126 U/L   Total Bilirubin 0.9 0.3 - 1.2 mg/dL   GFR, Estimated 54 (L) >60 mL/min   Anion gap 11 5 - 15  CBC     Status: Abnormal   Collection Time: 11/18/22  9:06 PM  Result Value Ref Range   WBC 8.4 4.0 - 10.5 K/uL   RBC 5.84 (H) 4.22 - 5.81 MIL/uL   Hemoglobin 17.7 (H) 13.0 - 17.0 g/dL   HCT 04.5 (H) 40.9 - 81.1 %   MCV 89.2 80.0 - 100.0 fL   MCH 30.3 26.0 - 34.0 pg   MCHC 34.0 30.0 - 36.0 g/dL   RDW 91.4 78.2 - 95.6 %   Platelets 170 150 - 400 K/uL   nRBC 0.0 0.0 - 0.2 %  Type and screen [facility]     Status: None (Preliminary result)   Collection Time: 11/18/22 11:23 PM  Result Value Ref Range   ABO/RH(D) PENDING    Antibody Screen PENDING    Sample Expiration      11/21/2022,2359 Performed at [facility], [address] [place], [place] [address]    Basic Metabolic Panel: Recent Labs  Lab 11/18/22 2106  NA 137  K 4.1  CL 101  CO2 25  GLUCOSE 114*  BUN 30*  CREATININE 1.42*  CALCIUM 9.0   Liver Function Tests: Recent Labs  Lab 11/18/22 2106  AST 24  ALT 25  ALKPHOS 59  BILITOT 0.9  PROT 7.6  ALBUMIN 4.2   No results for input(s): "LIPASE", "AMYLASE" in the last 168 hours. No results for input(s): "AMMONIA" in the last 168 hours. CBC: Recent Labs  Lab 11/18/22 2106  WBC 8.4  HGB 17.7*   HCT 52.1*  MCV 89.2  PLT 170   Cardiac Enzymes: No results for input(s): "CKTOTAL", "CKMB", "CKMBINDEX", "TROPONINIHS" in the last 168 hours.  BNP (last 3 results) No results for input(s): "PROBNP" in the last 8760  hours. CBG: No results for input(s): "GLUCAP" in the last 168 hours.  Radiological Exams on Admission:  No results found.   Assessment and Plan: * Lower GI bleeding Patient with 2 episodes of dark red/maroon color blood mixed with stool this evening.  No associated fever or abdominal pain.  Bleeding seems to have remitted at this time.  Good baseline hemoglobin to start out with, patient vitally stable.  At this time I will treat the patient with empiric pantoprazole, check INR PTT.  Type and screen has been sent by the ER provider.  Maintain the patient on IV fluids, patient has been advised to allow stool inspection.  Fluid infusion for next 10 hours.  Trend CBC.  I will perform a CAT scan in case there is any marked bleeding concerning for exsanguination that may benefit from IR intervention.  At this time avoid anticoagulation including aspirin that the patient has been taking as over-the-counter for occasional headaches.  Erythrocytosis Chronic check erythropoetin level.  Essential hypertension Patient blood pressure is actually pretty good, will continue patient's chronic home antihypertensive regimen.  However patient does take furosemide 3 times a week, which will be held at this time as patient is getting fluid infusion to trend his CBC   Please review med rec after pharmacy input.    Advance Care Planning:   Code Status: Not on file full code per pateitn.  Consults: none at this time.   Family Communication: per patient.  Severity of Illness: The appropriate patient status for this patient is OBSERVATION. Observation status is judged to be reasonable and necessary in order to provide the required intensity of service to ensure the patient's safety. The  patient's presenting symptoms, physical exam findings, and initial radiographic and laboratory data in the context of their medical condition is felt to place them at decreased risk for further clinical deterioration. Furthermore, it is anticipated that the patient will be medically stable for discharge from the hospital within 2 midnights of admission.   Author: Nolberto Hanlon, MD 11/18/2022 11:53 PM  For on call review [email or web address].

## 2022-11-19 DIAGNOSIS — D751 Secondary polycythemia: Secondary | ICD-10-CM | POA: Diagnosis not present

## 2022-11-19 DIAGNOSIS — I10 Essential (primary) hypertension: Secondary | ICD-10-CM

## 2022-11-19 DIAGNOSIS — K922 Gastrointestinal hemorrhage, unspecified: Secondary | ICD-10-CM | POA: Diagnosis not present

## 2022-11-19 LAB — CBC
HCT: 48.3 % (ref 39.0–52.0)
Hemoglobin: 16.7 g/dL (ref 13.0–17.0)
MCH: 30 pg (ref 26.0–34.0)
MCHC: 34.6 g/dL (ref 30.0–36.0)
MCV: 86.7 fL (ref 80.0–100.0)
Platelets: 159 10*3/uL (ref 150–400)
RBC: 5.57 MIL/uL (ref 4.22–5.81)
RDW: 13.3 % (ref 11.5–15.5)
WBC: 7.4 10*3/uL (ref 4.0–10.5)
nRBC: 0 % (ref 0.0–0.2)

## 2022-11-19 LAB — BASIC METABOLIC PANEL
Anion gap: 9 (ref 5–15)
BUN: 27 mg/dL — ABNORMAL HIGH (ref 8–23)
CO2: 21 mmol/L — ABNORMAL LOW (ref 22–32)
Calcium: 8.5 mg/dL — ABNORMAL LOW (ref 8.9–10.3)
Chloride: 106 mmol/L (ref 98–111)
Creatinine, Ser: 1.23 mg/dL (ref 0.61–1.24)
GFR, Estimated: >60 mL/min (ref >60)
Glucose, Bld: 126 mg/dL — ABNORMAL HIGH (ref 70–99)
Potassium: 3.9 mmol/L (ref 3.5–5.1)
Sodium: 136 mmol/L (ref 135–145)

## 2022-11-19 LAB — HIV ANTIBODY (ROUTINE TESTING W REFLEX): HIV Screen 4th Generation wRfx: NONREACTIVE

## 2022-11-19 LAB — TYPE AND SCREEN
ABO/RH(D): B POS
Antibody Screen: NEGATIVE

## 2022-11-19 LAB — PROTIME-INR
INR: 1.1 (ref 0.8–1.2)
Prothrombin Time: 14.2 s (ref 11.4–15.2)

## 2022-11-19 LAB — APTT: aPTT: 25 s (ref 24–36)

## 2022-11-19 MED ORDER — POLYETHYLENE GLYCOL 3350 17 G PO PACK: 17.0000 g | PACK | Freq: Two times a day (BID) | ORAL | Status: DC

## 2022-11-19 MED ORDER — POLYETHYLENE GLYCOL 3350 17 G PO PACK
17.0000 g | PACK | Freq: Every day | ORAL | Status: DC | PRN
Start: 2022-11-19 — End: 2022-11-19

## 2022-11-19 MED ORDER — FLEET ENEMA RE ENEM: 1.0000 | ENEMA | Freq: Once | RECTAL | Status: DC

## 2022-11-19 MED ORDER — SODIUM CHLORIDE 0.9% FLUSH
3.0000 mL | Freq: Two times a day (BID) | INTRAVENOUS | Status: DC
  Administered 2022-11-19 (×2): 3 mL via INTRAVENOUS

## 2022-11-19 MED ORDER — ACETAMINOPHEN 650 MG RE SUPP: 650.0000 mg | Freq: Four times a day (QID) | RECTAL | Status: DC | PRN

## 2022-11-19 MED ORDER — ACETAMINOPHEN 325 MG PO TABS
650.0000 mg | ORAL_TABLET | Freq: Four times a day (QID) | ORAL | Status: DC | PRN
  Administered 2022-11-19: 650 mg via ORAL
  Filled 2022-11-19: qty 2

## 2022-11-19 MED ORDER — OMEPRAZOLE 40 MG PO CPDR: 40.0000 mg | DELAYED_RELEASE_CAPSULE | Freq: Every day | ORAL | 1 refills | Status: DC

## 2022-11-19 NOTE — Assessment & Plan Note (Signed)
Patient blood pressure is actually pretty good, will continue patient's chronic home antihypertensive regimen.  However patient does take furosemide 3 times a week, which will be held at this time as patient is getting fluid infusion to trend his CBC

## 2022-11-19 NOTE — Discharge Summary (Signed)
Physician Discharge Summary   Patient: Francisco Freeman MRN: [id number] DOB: 04/14/1955  Admit date:     11/18/2022  Discharge date: 11/19/22  Discharge Physician: Arnetha Courser   PCP: Lynnea Ferrier, MD   Recommendations at discharge:  Please obtain CBC and BMP on follow-up Follow-up with primary care provider Follow-up with gastroenterology  Discharge Diagnoses: Principal Problem:   Lower GI bleeding Active Problems:   Essential hypertension   Erythrocytosis   Hospital Course: Taken from H&P.  Francisco Freeman is a 66 y.o. male with medical history significant of hypertension presented to ED after having 2 bowel movements with dark red/maroon color blood, started after having dinner this evening.  Patient had a recent colonoscopy in July 15, 2022 which was otherwise normal, few polyps were removed and also found diverticulosis in the transverse and left colon.  Pathology results with tubular adenoma.  10/26: Blood pressure elevated at 181/96, hemoglobin seems stable at 16.7, mild metabolic acidosis with CO2 of 21, BUN 27 and creatinine of 1.23.  Consulted GI.  Patient was also using Goody powder intermittently, last use was yesterday morning.  No other regular NSAID use.  Had 2 small bowel movements and complaining of having dark color blood on wiping, nothing in stool.  As hemoglobin remained stable, GI evaluated him and cleared him for discharge with close outpatient follow-up with his gastroenterologist.  Patient was advised not to use Goody powder and was given PPI.  May be him small hemorrhoidal versus diverticular bleed.  No epigastric pain or discomfort.  His gastroenterologist can decide about EGD as outpatient if needed.  Patient will continue on current medications and need to have a close follow-up with his providers for further management.   Consultants: Gastroenterology Procedures performed: None Disposition: Home Diet recommendation:  Discharge  Diet Orders (From admission, onward)     Start     Ordered   11/19/22 0000  Diet - low sodium heart healthy        11/19/22 1332           Cardiac diet DISCHARGE MEDICATION: Allergies as of 11/19/2022       Reactions   Amlodipine Besylate    REACTION: felt bad   Bisoprolol-hydrochlorothiazide    REACTION: Syncope   Clonidine    Other reaction(s): Other (See Comments) Raised blood pressure   Hydralazine    Other reaction(s): Other (See Comments) Caused hot flashes    Hydrochlorothiazide Other (See Comments)   gout   Irbesartan    REACTION: BP elevated   Lisinopril Cough   Lisinopril-hydrochlorothiazide    REACTION: Cough        Medication List     STOP taking these medications    doxycycline 100 MG capsule Commonly known as: VIBRAMYCIN       TAKE these medications    atenolol 25 MG tablet Commonly known as: TENORMIN Take 12.5 mg by mouth daily.   Cholecalciferol 125 MCG (5000 UT) Tabs Take 5,000 Units by mouth daily.   doxazosin 8 MG tablet Commonly known as: CARDURA Take 8 mg by mouth at bedtime.   furosemide 40 MG tablet Commonly known as: LASIX Take 40 mg by mouth daily as needed. Takes approx 3 x wk   losartan 50 MG tablet Commonly known as: COZAAR Take 12.5 mg by mouth daily.   omeprazole 40 MG capsule Commonly known as: PRILOSEC Take 1 capsule (40 mg total) by mouth daily.   potassium chloride 10 MEQ tablet Commonly known  asJerene Dilling Take 10 mEq by mouth daily.        Follow-up Information     Curtis Sites III, MD. Schedule an appointment as soon as possible for a visit in 1 week(s).   Specialty: Internal Medicine Contact information: [address] [facility] [place] [address] [phone]         [name], DO. Schedule an appointment as soon as possible for a visit in 1 week(s).   Specialty: Gastroenterology Contact information: [address] [facility] [place]  [place] [address] [phone]                Discharge Exam: Ceasar Mons Weights   11/18/22 2101  Weight: 136.1 kg   General.  Morbidly obese gentleman, in no acute distress. Pulmonary.  Lungs clear bilaterally, normal respiratory effort. CV.  Regular rate and rhythm, no JVD, rub or murmur. Abdomen.  Soft, nontender, nondistended, BS positive. CNS.  Alert and oriented .  No focal neurologic deficit. Extremities.  No edema, no cyanosis, pulses intact and symmetrical. Psychiatry.  Judgment and insight appears normal.   Condition at discharge: stable  The results of significant diagnostics from this hospitalization (including imaging, microbiology, ancillary and laboratory) are listed below for reference.   Imaging Studies: No results found.  Microbiology: Results for orders placed or performed during the hospital encounter of 12/21/21  Urine Culture     Status: None   Collection Time: 12/21/21  2:35 PM   Specimen: Urine, Random  Result Value Ref Range Status   Specimen Description   Final    URINE, RANDOM Performed at [facility], [address], [place], [place] [address]    Special Requests   Final    NONE Performed at [facility], [address] [place], [place] [address]    Culture   Final    NO GROWTH Performed at [facility], [address] [place], [place] [address]    Report Status 12/22/2021 FINAL  Final    Labs: CBC: Recent Labs  Lab 11/18/22 2106 11/19/22 0236  WBC 8.4 7.4  HGB 17.7* 16.7  HCT 52.1* 48.3  MCV 89.2 86.7  PLT 170 159   Basic Metabolic Panel: Recent Labs  Lab 11/18/22 2106 11/19/22 0236  NA 137 136  K 4.1 3.9  CL 101 106  CO2 25 21*  GLUCOSE 114* 126*  BUN 30* 27*  CREATININE 1.42* 1.23  CALCIUM 9.0 8.5*   Liver Function Tests: Recent Labs  Lab 11/18/22 2106  AST 24  ALT 25  ALKPHOS 59  BILITOT 0.9  PROT 7.6  ALBUMIN 4.2   CBG: No results for input(s): "GLUCAP" in the last 168  hours.  Discharge time spent: greater than 30 minutes.  This record has been created using Conservation officer, historic buildings. Errors have been sought and corrected,but may not always be located. Such creation errors do not reflect on the standard of care.   Signed: Arnetha Courser, MD [facility] 11/19/2022

## 2022-11-19 NOTE — Plan of Care (Signed)
  Problem: Nutrition: Goal: Adequate nutrition will be maintained Outcome: Progressing   Problem: Coping: Goal: Level of anxiety will decrease Outcome: Progressing   Problem: Pain Management: Goal: General experience of comfort will improve Outcome: Progressing   Problem: Safety: Goal: Ability to remain free from injury will improve Outcome: Progressing   Problem: Skin Integrity: Goal: Risk for impaired skin integrity will decrease Outcome: Progressing   Problem: Education: Goal: Ability to identify signs and symptoms of gastrointestinal bleeding will improve Outcome: Progressing   Problem: Bowel/Gastric: Goal: Will show no signs and symptoms of gastrointestinal bleeding Outcome: Not Progressing   Problem: Fluid Volume: Goal: Will show no signs and symptoms of excessive bleeding Outcome: Not Progressing   Problem: Clinical Measurements: Goal: Complications related to the disease process, condition or treatment will be avoided or minimized Outcome: Progressing

## 2022-11-19 NOTE — Consult Note (Signed)
[name], MD [facility]  [address] [place], [place] [address] Phone: [phone] Fax : [phone]  Consultation  Referring Provider:     Dr. Nelson Chimes Primary Care Physician:  Lynnea Ferrier, MD Primary Gastroenterologist:  Dr. Timothy Lasso         Reason for Consultation:     GI bleed  Date of Admission:  11/18/2022 Date of Consultation:  11/19/2022          HPI:   Francisco Freeman is a 67 y.o. male who came to the emergency room for evaluation with a report of 2 episodes of bloody stools.  The patient had reported to the emergency room physician that the stools were dark red and maroon.  His initial feeling was that he felt that he had an urge to use the restroom and then passed the blood.  He also reported that he had been using Goody powders frequently.  The patient had a colonoscopy by Dr. Timothy Lasso that showed:  Impression: - The examined portion of the ileum was normal.  - Two 3 to 4 mm polyps in the rectum and in the transverse colon, removed with a cold snare. Resected and retrieved.  - One 1 to 2 mm polyp in the transverse colon, removed with a jumbo cold forceps. Resected and retrieved.  - One 13 to 15 mm polyp at the hepatic flexure, removed piecemeal using a cold snare. Resected and retrieved.  - Diverticulosis in the transverse colon and in the left colon.  - The examination was otherwise normal on direct and retroflexion views.  It was recommended that the patient have a repeat colonoscopy in 6 to 12 months due to the piecemeal nature of the polyp removal.  On admission the patient had a hemoglobin of 17.7 with a repeat of 16.7.  The patient also had a slightly elevated BUN. The patient denies any shortness of breath dizziness or lightheadedness.  He had a very small bowel movement this morning with some old blood.  Past Medical History:  Diagnosis Date   Arthritis    Gout   B12 deficiency    Edema    Gout    Gout    History of kidney stones     Hypertension    Prostate cancer (HCC)    Sleep apnea    Thrombocytopenia (HCC)     Past Surgical History:  Procedure Laterality Date   BALLOON DILATION N/A 11/12/2021   Procedure: OPTILUME BALLOON DILATION;  Surgeon: Sondra Come, MD;  Location: [facility] ORS;  Service: Urology;  Laterality: N/A;   COLONOSCOPY     COLONOSCOPY N/A 12/28/2016   Procedure: COLONOSCOPY;  Surgeon: Scot Jun, MD;  Location: [facility] ENDOSCOPY;  Service: Endoscopy;  Laterality: N/A;   COLONOSCOPY WITH PROPOFOL N/A 07/15/2022   Procedure: COLONOSCOPY WITH PROPOFOL;  Surgeon: [name], DO;  Location: [facility] ENDOSCOPY;  Service: Gastroenterology;  Laterality: N/A;   CYSTOSCOPY WITH RETROGRADE URETHROGRAM N/A 11/12/2021   Procedure: CYSTOSCOPY WITH RETROGRADE URETHROGRAM;  Surgeon: Sondra Come, MD;  Location: [facility] ORS;  Service: Urology;  Laterality: N/A;   POLYPECTOMY  07/15/2022   Procedure: POLYPECTOMY;  Surgeon: [name], DO;  Location: [facility] ENDOSCOPY;  Service: Gastroenterology;;   PROSTATE SURGERY     robot assisted prostatectomy   PROSTATECTOMY  01/24/2006   TONSILLECTOMY      Prior to Admission medications   Medication Sig Start Date End Date Taking? Authorizing Provider  atenolol (  TENORMIN) 25 MG tablet Take 12.5 mg by mouth daily.    [provider]  Cholecalciferol 125 MCG (5000 UT) TABS Take 5,000 Units by mouth daily.    [provider]  doxazosin (CARDURA) 8 MG tablet Take 8 mg by mouth at bedtime. 07/23/21   [provider]  doxycycline (VIBRAMYCIN) 100 MG capsule Take 1 capsule (100 mg total) by mouth every 12 (twelve) hours. 02/16/22   Sondra Come, MD  furosemide (LASIX) 40 MG tablet Take 40 mg by mouth daily as needed. Takes approx 3 x wk    [provider]  losartan (COZAAR) 50 MG tablet Take 12.5 mg by mouth daily. 07/25/21   [provider]  potassium chloride (KLOR-CON M) 10 MEQ tablet Take 10 mEq by mouth daily.  10/27/21   [provider]    Family History  Problem Relation Age of Onset   Cancer Mother 55       breast   Cancer Father        colon   Hypertension Father      Social History   Tobacco Use   Smoking status: Never    Passive exposure: Never   Smokeless tobacco: Never  Vaping Use   Vaping status: Never Used  Substance Use Topics   Alcohol use: Yes    Alcohol/week: 1.0 standard drink of alcohol    Types: 1 Cans of beer per week    Comment: once a week   Drug use: No    Allergies as of 11/18/2022 - Review Complete 11/18/2022  Allergen Reaction Noted   Amlodipine besylate  11/22/2006   Bisoprolol-hydrochlorothiazide     Clonidine  03/02/2018   Hydralazine  03/02/2018   Hydrochlorothiazide Other (See Comments) 07/16/2013   Irbesartan     Lisinopril Cough 07/16/2013   Lisinopril-hydrochlorothiazide      Review of Systems:    All systems reviewed and negative except where noted in HPI.   Physical Exam:  Vital signs in last 24 hours: Temp:  [97.9 F (36.6 C)-98.1 F (36.7 C)] 98 F (36.7 C) (10/26 0808) Pulse Rate:  [50-64] 50 (10/26 0811) Resp:  [16-20] 18 (10/26 0808) BP: (147-186)/(82-107) 181/96 (10/26 0811) SpO2:  [90 %-97 %] 95 % (10/26 0808) Weight:  [136.1 kg] 136.1 kg (10/25 2101) Last BM Date : 11/19/22 General:   Pleasant, cooperative in NAD Head:  Normocephalic and atraumatic. Eyes:   No icterus.   Conjunctiva pink. PERRLA. Ears:  Normal auditory acuity. Neck:  Supple; no masses or thyroidomegaly Lungs: Respirations even and unlabored. Lungs clear to auscultation bilaterally.   No wheezes, crackles, or rhonchi.  Heart:  Regular rate and rhythm;  Without murmur, clicks, rubs or gallops Abdomen:  Soft, nondistended, nontender. Normal bowel sounds. No appreciable masses or hepatomegaly.  No rebound or guarding.  Rectal:  Not performed. Msk:  Symmetrical without gross deformities.    Extremities:  Without edema, cyanosis or  clubbing. Neurologic:  Alert and oriented x3;  grossly normal neurologically. Skin:  Intact without significant lesions or rashes. Cervical Nodes:  No significant cervical adenopathy. Psych:  Alert and cooperative. Normal affect.  LAB RESULTS: Recent Labs    11/18/22 2106 11/19/22 0236  WBC 8.4 7.4  HGB 17.7* 16.7  HCT 52.1* 48.3  PLT 170 159   BMET Recent Labs    11/18/22 2106 11/19/22 0236  NA 137 136  K 4.1 3.9  CL 101 106  CO2 25 21*  GLUCOSE 114* 126*  BUN  30* 27*  CREATININE 1.42* 1.23  CALCIUM 9.0 8.5*   LFT Recent Labs    11/18/22 2106  PROT 7.6  ALBUMIN 4.2  AST 24  ALT 25  ALKPHOS 59  BILITOT 0.9   PT/INR Recent Labs    11/19/22 0236  LABPROT 14.2  INR 1.1    STUDIES: No results found.    Impression / Plan:   Assessment: Principal Problem:   Lower GI bleeding Active Problems:   Essential hypertension   Erythrocytosis   Abdulmajid Sunn is a 67 y.o. y/o male with who comes in with what sounds like a diverticular bleed due to the fact that he had a colonoscopy in June that did not show any other abnormalities except for polyps that were removed.  The patient unlikely had a upper GI bleed with his blood pressures remaining normal and his hemoglobin still being in the normal range.  Plan:  The patient has been told that he likely had a diverticular bleed and that these are not amenable to endoscopic treatment.  He has been told that if he has any further bleeding he may need a CT angiogram to isolate what part of the colon is bleeding and may need angiography.  He has been told that he could have another episode today a week from now or anytime in the future and nobody can judge when that may happen.  He has been told to stay away from NSAIDs.  He does not need any acute GI workup while in the hospital at this time and he should follow-up with Dr. Timothy Lasso upon discharge.  I will sign off at this time and if he has any further bleeding  then please get a CT angiogram and notify me.  Thank you for involving me in the care of this patient.      LOS: 0 days   [name], MD, [facility] 11/19/2022, 10:57 AM,  Pager [phone] 7am-5pm  Check AMION for 5pm -7am coverage and on weekends   Note: This dictation was prepared with Dragon dictation along with smaller phrase technology. Any transcriptional errors that result from this process are unintentional.

## 2022-11-19 NOTE — Assessment & Plan Note (Signed)
Patient with 2 episodes of dark red/maroon color blood mixed with stool this evening.  No associated fever or abdominal pain.  Bleeding seems to have remitted at this time.  Good baseline hemoglobin to start out with, patient vitally stable.  At this time I will treat the patient with empiric pantoprazole, check INR PTT.  Type and screen has been sent by the ER provider.  Maintain the patient on IV fluids, patient has been advised to allow stool inspection.  Fluid infusion for next 10 hours.  Trend CBC.  I will perform a CAT scan in case there is any marked bleeding concerning for exsanguination that may benefit from IR intervention.  At this time avoid anticoagulation including aspirin that the patient has been taking as over-the-counter for occasional headaches.

## 2022-11-19 NOTE — Progress Notes (Signed)
Order received to discontinue telemetry 

## 2022-11-19 NOTE — Hospital Course (Addendum)
Taken from H&P.  Francisco Freeman is a 67 y.o. male with medical history significant of hypertension presented to ED after having 2 bowel movements with dark red/maroon color blood, started after having dinner this evening.  Patient had a recent colonoscopy in July 15, 2022 which was otherwise normal, few polyps were removed and also found diverticulosis in the transverse and left colon.  Pathology results with tubular adenoma.  10/26: Blood pressure elevated at 181/96, hemoglobin seems stable at 16.7, mild metabolic acidosis with CO2 of 21, BUN 27 and creatinine of 1.23.  Consulted GI.  Patient was also using Goody powder intermittently, last use was yesterday morning.  No other regular NSAID use.  Had 2 small bowel movements and complaining of having dark color blood on wiping, nothing in stool.  As hemoglobin remained stable, GI evaluated him and cleared him for discharge with close outpatient follow-up with his gastroenterologist.  Patient was advised not to use Goody powder and was given PPI.  May be him small hemorrhoidal versus diverticular bleed.  No epigastric pain or discomfort.  His gastroenterologist can decide about EGD as outpatient if needed.  Patient will continue on current medications and need to have a close follow-up with his providers for further management.

## 2022-11-21 LAB — ERYTHROPOIETIN: Erythropoietin: 14.8 m[IU]/mL (ref 2.6–18.5)

## 2023-02-03 ENCOUNTER — Other Ambulatory Visit (HOSPITAL_COMMUNITY): Payer: Self-pay | Admitting: Urology

## 2023-02-03 DIAGNOSIS — C61 Malignant neoplasm of prostate: Secondary | ICD-10-CM

## 2023-02-14 ENCOUNTER — Encounter (HOSPITAL_COMMUNITY)
Admission: RE | Admit: 2023-02-14 | Discharge: 2023-02-14 | Disposition: A | Discharge status: Home / Self Care | Payer: Medicare Other | Source: Ambulatory Visit | Attending: Urology | Admitting: Urology

## 2023-02-14 DIAGNOSIS — C61 Malignant neoplasm of prostate: Secondary | ICD-10-CM | POA: Diagnosis present

## 2023-02-14 MED ORDER — FLOTUFOLASTAT F 18 GALLIUM 296-5846 MBQ/ML IV SOLN
8.0090 | Freq: Once | INTRAVENOUS | Status: AC
  Administered 2023-02-14: 8.009 via INTRAVENOUS

## 2023-04-18 ENCOUNTER — Encounter: Payer: Self-pay | Admitting: Gastroenterology

## 2023-04-27 ENCOUNTER — Ambulatory Visit
Admission: RE | Admit: 2023-04-27 | Discharge: 2023-04-27 | Disposition: A | Discharge status: Home / Self Care | Payer: Medicare Other | Attending: Gastroenterology | Admitting: Gastroenterology

## 2023-04-27 ENCOUNTER — Other Ambulatory Visit: Payer: Self-pay

## 2023-04-27 ENCOUNTER — Encounter: Payer: Self-pay | Admitting: Gastroenterology

## 2023-04-27 ENCOUNTER — Ambulatory Visit: Admitting: Anesthesiology

## 2023-04-27 ENCOUNTER — Encounter
Admission: RE | Disposition: A | Discharge status: Home / Self Care | Payer: Self-pay | Source: Home / Self Care | Attending: Gastroenterology

## 2023-04-27 DIAGNOSIS — K5730 Diverticulosis of large intestine without perforation or abscess without bleeding: Secondary | ICD-10-CM | POA: Diagnosis not present

## 2023-04-27 DIAGNOSIS — K635 Polyp of colon: Secondary | ICD-10-CM | POA: Insufficient documentation

## 2023-04-27 DIAGNOSIS — G4730 Sleep apnea, unspecified: Secondary | ICD-10-CM | POA: Diagnosis not present

## 2023-04-27 DIAGNOSIS — Z79899 Other long term (current) drug therapy: Secondary | ICD-10-CM | POA: Insufficient documentation

## 2023-04-27 DIAGNOSIS — Z860101 Personal history of adenomatous and serrated colon polyps: Secondary | ICD-10-CM | POA: Diagnosis present

## 2023-04-27 DIAGNOSIS — Z6841 Body Mass Index (BMI) 40.0 and over, adult: Secondary | ICD-10-CM | POA: Diagnosis not present

## 2023-04-27 DIAGNOSIS — Z1211 Encounter for screening for malignant neoplasm of colon: Secondary | ICD-10-CM | POA: Diagnosis not present

## 2023-04-27 DIAGNOSIS — Z800 Family history of malignant neoplasm of digestive organs: Secondary | ICD-10-CM | POA: Diagnosis not present

## 2023-04-27 DIAGNOSIS — I129 Hypertensive chronic kidney disease with stage 1 through stage 4 chronic kidney disease, or unspecified chronic kidney disease: Secondary | ICD-10-CM | POA: Diagnosis not present

## 2023-04-27 DIAGNOSIS — K219 Gastro-esophageal reflux disease without esophagitis: Secondary | ICD-10-CM | POA: Diagnosis not present

## 2023-04-27 DIAGNOSIS — E66813 Obesity, class 3: Secondary | ICD-10-CM | POA: Diagnosis not present

## 2023-04-27 DIAGNOSIS — D122 Benign neoplasm of ascending colon: Secondary | ICD-10-CM | POA: Insufficient documentation

## 2023-04-27 DIAGNOSIS — I4891 Unspecified atrial fibrillation: Secondary | ICD-10-CM | POA: Diagnosis not present

## 2023-04-27 DIAGNOSIS — M1990 Unspecified osteoarthritis, unspecified site: Secondary | ICD-10-CM | POA: Diagnosis not present

## 2023-04-27 DIAGNOSIS — N189 Chronic kidney disease, unspecified: Secondary | ICD-10-CM | POA: Diagnosis not present

## 2023-04-27 HISTORY — PX: POLYPECTOMY: SHX5525

## 2023-04-27 HISTORY — PX: COLONOSCOPY WITH PROPOFOL: SHX5780

## 2023-04-27 HISTORY — DX: Chronic kidney disease, unspecified: N18.9

## 2023-04-27 HISTORY — DX: Malignant neoplasm of prostate: C61

## 2023-04-27 SURGERY — COLONOSCOPY WITH PROPOFOL
Anesthesia: General

## 2023-04-27 MED ORDER — PROPOFOL 10 MG/ML IV BOLUS
INTRAVENOUS | Status: DC | PRN
  Administered 2023-04-27: 30 mg via INTRAVENOUS
  Administered 2023-04-27: 70 mg via INTRAVENOUS

## 2023-04-27 MED ORDER — PROPOFOL 500 MG/50ML IV EMUL
INTRAVENOUS | Status: DC | PRN
  Administered 2023-04-27: 165 ug/kg/min via INTRAVENOUS

## 2023-04-27 MED ORDER — LIDOCAINE HCL (CARDIAC) PF 100 MG/5ML IV SOSY
PREFILLED_SYRINGE | INTRAVENOUS | Status: DC | PRN
  Administered 2023-04-27: 100 mg via INTRAVENOUS

## 2023-04-27 MED ORDER — SODIUM CHLORIDE 0.9 % IV SOLN: INTRAVENOUS | Status: DC

## 2023-04-27 MED ORDER — GLYCOPYRROLATE 0.2 MG/ML IJ SOLN
INTRAMUSCULAR | Status: DC | PRN
  Administered 2023-04-27: .2 mg via INTRAVENOUS

## 2023-04-27 NOTE — Op Note (Signed)
 [facility] Gastroenterology Patient Name: Francisco Freeman Procedure Date: 04/27/2023 10:38 AM MRN: [id number] Account #: [id number] Date of Birth: 04-12-1955 Admit Type: Outpatient Age: 68 Room: [facility] ENDO ROOM 1 Gender: Male Note Status: Finalized Instrument Name: Colonoscope [id number] Procedure:             Colonoscopy Indications:           High risk colon cancer surveillance: Personal history                         of colonic polyps Providers:             Trenda Moots, DO Referring MD:          Daniel Nones, MD (Referring MD) Medicines:             Monitored Anesthesia Care Complications:         No immediate complications. Estimated blood loss:                         Minimal. Procedure:             Pre-Anesthesia Assessment:                        - Prior to the procedure, a History and Physical was                         performed, and patient medications and allergies were                         reviewed. The patient is competent. The risks and                         benefits of the procedure and the sedation options and                         risks were discussed with the patient. All questions                         were answered and informed consent was obtained.                         Patient identification and proposed procedure were                         verified by the physician, the nurse, the anesthetist                         and the technician in the endoscopy suite. Mental                         Status Examination: alert and oriented. Airway                         Examination: normal oropharyngeal airway and neck                         mobility. Respiratory Examination: clear to  auscultation. CV Examination: bradycardia noted.                         Prophylactic Antibiotics: The patient does not require                         prophylactic antibiotics. Prior Anticoagulants: The                          patient has taken no anticoagulant or antiplatelet                         agents. ASA Grade Assessment: III - A patient with                         severe systemic disease. After reviewing the risks and                         benefits, the patient was deemed in satisfactory                         condition to undergo the procedure. The anesthesia                         plan was to use monitored anesthesia care (MAC).                         Immediately prior to administration of medications,                         the patient was re-assessed for adequacy to receive                         sedatives. The heart rate, respiratory rate, oxygen                         saturations, blood pressure, adequacy of pulmonary                         ventilation, and response to care were monitored                         throughout the procedure. The physical status of the                         patient was re-assessed after the procedure.                        After obtaining informed consent, the colonoscope was                         passed under direct vision. Throughout the procedure,                         the patient's blood pressure, pulse, and oxygen                         saturations were monitored continuously. The  Colonoscope was introduced through the anus and                         advanced to the the cecum, identified by appendiceal                         orifice and ileocecal valve. The colonoscopy was                         performed without difficulty. The patient tolerated                         the procedure well. The quality of the bowel                         preparation was evaluated using the BBPS [facility] Bowel                         Preparation Scale) with scores of: Right Colon = 2                         (minor amount of residual staining, small fragments of                         stool and/or opaque liquid, but mucosa seen well),                          Transverse Colon = 2 (minor amount of residual                         staining, small fragments of stool and/or opaque                         liquid, but mucosa seen well) and Left Colon = 2                         (minor amount of residual staining, small fragments of                         stool and/or opaque liquid, but mucosa seen well). The                         total BBPS score equals 6. Fair Prep. The ileocecal                         valve, appendiceal orifice, and rectum were                         photographed. Findings:      The perianal and digital rectal examinations were normal. Pertinent       negatives include normal sphincter tone.      Retroflexion in the right colon was performed.      Two sessile polyps were found in the descending colon and ascending       colon. The polyps were 1 to 2 mm in size. These polyps were removed with       a jumbo cold forceps. Resection and retrieval  were complete. Estimated       blood loss was minimal.      Two sessile polyps were found in the ascending colon. The polyps were 3       to 8 mm in size. These polyps were removed with a cold snare. Resection       and retrieval were complete. Estimated blood loss was minimal.      Multiple small-mouthed diverticula were found in the transverse colon       and left colon. Estimated blood loss: none.      The exam was otherwise without abnormality on direct and retroflexion       views. Impression:            - Two 1 to 2 mm polyps in the descending colon and in                         the ascending colon, removed with a jumbo cold                         forceps. Resected and retrieved.                        - Two 3 to 8 mm polyps in the ascending colon, removed                         with a cold snare. Resected and retrieved.                        - Diverticulosis in the transverse colon and in the                         left colon.                        -  The examination was otherwise normal on direct and                         retroflexion views. Recommendation:        - Patient has a contact number available for                         emergencies. The signs and symptoms of potential                         delayed complications were discussed with the patient.                         Return to normal activities tomorrow. Written                         discharge instructions were provided to the patient.                        - Discharge patient to home.                        - Resume previous diet.                        -  Continue present medications.                        - No ibuprofen, naproxen, or other non-steroidal                         anti-inflammatory drugs for 5 days after polyp removal.                        - Await pathology results.                        - Repeat colonoscopy for surveillance based on                         pathology results.                        - Return to referring physician as previously                         scheduled.                        - The findings and recommendations were discussed with                         the patient. Procedure Code(s):     --- Professional ---                        [phone], Colonoscopy, flexible; with removal of                         tumor(s), polyp(s), or other lesion(s) by snare                         technique                        45380, 59, Colonoscopy, flexible; with biopsy, single                         or multiple Diagnosis Code(s):     --- Professional ---                        Z86.010, Personal history of colonic polyps                        D12.4, Benign neoplasm of descending colon                        D12.2, Benign neoplasm of ascending colon                        K57.30, Diverticulosis of large intestine without                         perforation or abscess without bleeding CPT copyright 2022 American Medical Association. All  rights reserved. The codes documented in this report are preliminary and upon coder review may  be revised to meet current compliance requirements. Attending Participation:  I personally performed the entire procedure. Elfredia Nevins, DO [name] DO, DO 04/27/2023 11:19:37 AM This report has been signed electronically. Number of Addenda: 0 Note Initiated On: 04/27/2023 10:38 AM Scope Withdrawal Time: 0 hours 15 minutes 10 seconds  Total Procedure Duration: 0 hours 18 minutes 57 seconds  Estimated Blood Loss:  Estimated blood loss was minimal.      [facility]

## 2023-04-27 NOTE — Anesthesia Preprocedure Evaluation (Signed)
 Anesthesia Evaluation  Patient identified by MRN, date of birth, ID band Patient awake    Reviewed: Allergy & Precautions, NPO status , Patient's Chart, lab work & pertinent test results  History of Anesthesia Complications Negative for: history of anesthetic complications  Airway Mallampati: III  TM Distance: >3 FB Neck ROM: full    Dental no notable dental hx.    Pulmonary sleep apnea    Pulmonary exam normal        Cardiovascular hypertension, On Medications negative cardio ROS Normal cardiovascular exam     Neuro/Psych negative neurological ROS  negative psych ROS   GI/Hepatic Neg liver ROS,GERD  ,,  Endo/Other    Class 3 obesity  Renal/GU Renal disease  negative genitourinary   Musculoskeletal  (+) Arthritis ,    Abdominal   Peds  Hematology negative hematology ROS (+)   Anesthesia Other Findings Past Medical History: No date: Arthritis     Comment:  Gout No date: B12 deficiency No date: Chronic kidney disease No date: Edema No date: Gout No date: History of kidney stones No date: Hypertension No date: Prostate cancer (HCC) No date: Recurrent prostate cancer (HCC) No date: Sleep apnea No date: Thrombocytopenia (HCC)  Past Surgical History: 11/12/2021: BALLOON DILATION; N/A     Comment:  Procedure: OPTILUME BALLOON DILATION;  Surgeon: Sondra Come, MD;  Location: [facility] ORS;  Service: Urology;                Laterality: N/A; No date: COLONOSCOPY 12/28/2016: COLONOSCOPY; N/A     Comment:  Procedure: COLONOSCOPY;  Surgeon: Scot Jun, MD;              Location: [facility] ENDOSCOPY;  Service: Endoscopy;                Laterality: N/A; 07/15/2022: COLONOSCOPY WITH PROPOFOL; N/A     Comment:  Procedure: COLONOSCOPY WITH PROPOFOL;  Surgeon: [name], DO;  Location: [facility] ENDOSCOPY;  Service:               Gastroenterology;  Laterality: N/A; 11/12/2021:  CYSTOSCOPY WITH RETROGRADE URETHROGRAM; N/A     Comment:  Procedure: CYSTOSCOPY WITH RETROGRADE URETHROGRAM;                Surgeon: Sondra Come, MD;  Location: [facility] ORS;                Service: Urology;  Laterality: N/A; 07/15/2022: POLYPECTOMY     Comment:  Procedure: POLYPECTOMY;  Surgeon: [name],              DO;  Location: [facility] ENDOSCOPY;  Service:               Gastroenterology;; No date: PROSTATE SURGERY     Comment:  robot assisted prostatectomy 01/24/2006: PROSTATECTOMY No date: TONSILLECTOMY     Reproductive/Obstetrics negative OB ROS                             Anesthesia Physical Anesthesia Plan  ASA: 3  Anesthesia Plan: General   Post-op Pain Management: Minimal or no pain anticipated   Induction: Intravenous  PONV Risk Score and Plan: 1 and Propofol infusion and TIVA  Airway Management Planned: Natural Airway and Nasal Cannula  Additional Equipment:  Intra-op Plan:   Post-operative Plan:   Informed Consent: I have reviewed the patients History and Physical, chart, labs and discussed the procedure including the risks, benefits and alternatives for the proposed anesthesia with the patient or authorized representative who has indicated his/her understanding and acceptance.     Dental Advisory Given  Plan Discussed with: Anesthesiologist, CRNA and Surgeon  Anesthesia Plan Comments: (Patient consented for risks of anesthesia including but not limited to:  - adverse reactions to medications - risk of airway placement if required - damage to eyes, teeth, lips or other oral mucosa - nerve damage due to positioning  - sore throat or hoarseness - Damage to heart, brain, nerves, lungs, other parts of body or loss of life  Patient voiced understanding and assent.)       Anesthesia Quick Evaluation

## 2023-04-27 NOTE — Anesthesia Postprocedure Evaluation (Signed)
 Anesthesia Post Note  Patient: Mycal Conde  Procedure(s) Performed: COLONOSCOPY WITH PROPOFOL POLYPECTOMY  Patient location during evaluation: Endoscopy Anesthesia Type: General Level of consciousness: awake and alert Pain management: pain level controlled Vital Signs Assessment: post-procedure vital signs reviewed and stable Respiratory status: spontaneous breathing, nonlabored ventilation, respiratory function stable and patient connected to nasal cannula oxygen Cardiovascular status: blood pressure returned to baseline and stable Postop Assessment: no apparent nausea or vomiting Anesthetic complications: no   No notable events documented.   Last Vitals:  Vitals:   04/27/23 1148 04/27/23 1203  BP: 116/81 (!) 130/100  Pulse: (!) 53 86  Resp: (!) 25 18  Temp:    SpO2: 95% 99%    Last Pain:  Vitals:   04/27/23 1203  TempSrc:   PainSc: 0-No pain                 Louie Boston

## 2023-04-27 NOTE — Progress Notes (Signed)
 Atrial flutter noted on monitor in endo Recovery. Dr. Joelene Millin and dr. Timothy Lasso notified. 12 lead EKG ordered and completed

## 2023-04-27 NOTE — Transfer of Care (Signed)
 Immediate Anesthesia Transfer of Care Note  Patient: Francisco Freeman  Procedure(s) Performed: COLONOSCOPY WITH PROPOFOL POLYPECTOMY  Patient Location: Endoscopy Unit  Anesthesia Type:General  Level of Consciousness: drowsy and patient cooperative  Airway & Oxygen Therapy: Patient Spontanous Breathing and Patient connected to face mask oxygen  Post-op Assessment: Report given to RN and Post -op Vital signs reviewed and stable  Post vital signs: Reviewed and stable  Last Vitals:  Vitals Value Taken Time  BP 112/68 04/27/23 1115  Temp 36 C 04/27/23 1115  Pulse 112 04/27/23 1118  Resp 14 04/27/23 1118  SpO2 98 % 04/27/23 1118  Vitals shown include unfiled device data.  Last Pain:  Vitals:   04/27/23 1115  TempSrc: Temporal  PainSc: Asleep         Complications: No notable events documented.

## 2023-04-27 NOTE — H&P (Signed)
 Pre-Procedure H&P   Patient ID: Francisco Freeman is a 68 y.o. male.  Gastroenterology Provider: [name], DO  PCP: Francisco Ferrier, MD  Date: 04/27/2023  HPI [name] is a 68 y.o. male who presents today for Colonoscopy for Personal history of colon polyps .  Patient suffered from a diverticular bleed in October 2024 in the setting of increased BC powder use.  He was seen in February and was no longer having any hematochezia or melena.  Patient underwent colonoscopy in June 2024 with multiple adenomatous polyps including one 13 to 15 mm in size in the hepatic flexure that was removed in piecemeal.  Exam also demonstrated left-sided and transverse colon diverticulosis.  TI was normal  Colonoscopy December 2018 with sessile serrated polyp.  Negative colonoscopies in 2013 and 2005  Hemoglobin 16.7 MCV 86.7 platelets 159,000 creatinine 1.23  History of prostate cancer status post radiation and surgery Positive family history of colon cancer in his father   Past Medical History:  Diagnosis Date   Arthritis    Gout   B12 deficiency    Chronic kidney disease    Edema    Gout    History of kidney stones    Hypertension    Prostate cancer (HCC)    Recurrent prostate cancer (HCC)    Sleep apnea    Thrombocytopenia (HCC)     Past Surgical History:  Procedure Laterality Date   BALLOON DILATION N/A 11/12/2021   Procedure: OPTILUME BALLOON DILATION;  Surgeon: Francisco Come, MD;  Location: [facility] ORS;  Service: Urology;  Laterality: N/A;   COLONOSCOPY     COLONOSCOPY N/A 12/28/2016   Procedure: COLONOSCOPY;  Surgeon: Francisco Jun, MD;  Location: [facility] ENDOSCOPY;  Service: Endoscopy;  Laterality: N/A;   COLONOSCOPY WITH PROPOFOL N/A 07/15/2022   Procedure: COLONOSCOPY WITH PROPOFOL;  Surgeon: Francisco Collins, DO;  Location: [facility] ENDOSCOPY;  Service: Gastroenterology;  Laterality: N/A;   CYSTOSCOPY WITH RETROGRADE URETHROGRAM N/A  11/12/2021   Procedure: CYSTOSCOPY WITH RETROGRADE URETHROGRAM;  Surgeon: Francisco Come, MD;  Location: [facility] ORS;  Service: Urology;  Laterality: N/A;   POLYPECTOMY  07/15/2022   Procedure: POLYPECTOMY;  Surgeon: Francisco Collins, DO;  Location: [facility] ENDOSCOPY;  Service: Gastroenterology;;   PROSTATE SURGERY     robot assisted prostatectomy   PROSTATECTOMY  01/24/2006   TONSILLECTOMY      Family History Father- CRC No other h/o GI disease or malignancy  Review of Systems  Constitutional:  Negative for activity change, appetite change, chills, diaphoresis, fatigue, fever and unexpected weight change.  HENT:  Negative for trouble swallowing and voice change.   Respiratory:  Negative for shortness of breath and wheezing.   Cardiovascular:  Negative for chest pain, palpitations and leg swelling.  Gastrointestinal:  Negative for abdominal distention, abdominal pain, anal bleeding, blood in stool, constipation, diarrhea, nausea and vomiting.  Musculoskeletal:  Negative for arthralgias and myalgias.  Skin:  Negative for color change and pallor.  Neurological:  Negative for dizziness, syncope and weakness.  Psychiatric/Behavioral:  Negative for confusion. The patient is not nervous/anxious.   All other systems reviewed and are negative.    Medications No current facility-administered medications on file prior to encounter.   Current Outpatient Medications on File Prior to Encounter  Medication Sig Dispense Refill   atenolol (TENORMIN) 25 MG tablet Take 12.5 mg by mouth daily.     Cholecalciferol 125 MCG (5000 UT) TABS Take 5,000 Units by  mouth daily.     doxazosin (CARDURA) 8 MG tablet Take 8 mg by mouth at bedtime.     furosemide (LASIX) 40 MG tablet Take 40 mg by mouth daily as needed. Takes approx 3 x wk     losartan (COZAAR) 50 MG tablet Take 12.5 mg by mouth daily.     potassium chloride (KLOR-CON M) 10 MEQ tablet Take 10 mEq by mouth daily.     omeprazole (PRILOSEC) 40 MG  capsule Take 1 capsule (40 mg total) by mouth daily. 30 capsule 1    Pertinent medications related to GI and procedure were reviewed by me with the patient prior to the procedure   Current Facility-Administered Medications:    0.9 %  sodium chloride infusion, , Intravenous, Continuous, Francisco Collins, DO, Last Rate: 20 mL/hr at 04/27/23 1026, New Bag at 04/27/23 1026  sodium chloride 20 mL/hr at 04/27/23 1026       Allergies  Allergen Reactions   Amlodipine Besylate     REACTION: felt bad   Bisoprolol-Hydrochlorothiazide     REACTION: Syncope   Clonidine     Other reaction(s): Other (See Comments) Raised blood pressure   Hydralazine     Other reaction(s): Other (See Comments) Caused hot flashes    Hydrochlorothiazide Other (See Comments)    gout   Irbesartan     REACTION: BP elevated   Lisinopril Cough   Lisinopril-Hydrochlorothiazide     REACTION: Cough   Allergies were reviewed by me prior to the procedure  Objective   Body mass index is 45.93 kg/m. Vitals:   04/27/23 1011 04/27/23 1017 04/27/23 1034  BP: (!) 156/114 (!) 143/99   Pulse: (!) 57  89  Resp: 18    Temp: (!) 97.1 F (36.2 C)    TempSrc: Temporal    SpO2: 98%    Weight: (!) 141.1 kg    Height: 5\' 9"  (1.753 m)        Physical Exam Vitals and nursing note reviewed.  Constitutional:      General: He is not in acute distress.    Appearance: Normal appearance. He is obese. He is not ill-appearing, toxic-appearing or diaphoretic.  HENT:     Head: Normocephalic and atraumatic.     Nose: Nose normal.     Mouth/Throat:     Mouth: Mucous membranes are moist.     Pharynx: Oropharynx is clear.  Eyes:     General: No scleral icterus.    Extraocular Movements: Extraocular movements intact.  Cardiovascular:     Rate and Rhythm: Bradycardia present. Rhythm irregular.     Heart sounds: Normal heart sounds. No murmur heard.    No friction rub. No gallop.  Pulmonary:     Effort: Pulmonary effort  is normal. No respiratory distress.     Breath sounds: Normal breath sounds. No wheezing, rhonchi or rales.  Abdominal:     General: Abdomen is flat. Bowel sounds are normal. There is no distension.     Palpations: Abdomen is soft.     Tenderness: There is no abdominal tenderness. There is no guarding or rebound.  Musculoskeletal:     Cervical back: Neck supple.     Right lower leg: No edema.     Left lower leg: No edema.  Skin:    General: Skin is warm and dry.     Coloration: Skin is not jaundiced or pale.  Neurological:     General: No focal deficit present.  Mental Status: He is alert and oriented to person, place, and time. Mental status is at baseline.  Psychiatric:        Mood and Affect: Mood normal.        Behavior: Behavior normal.        Thought Content: Thought content normal.        Judgment: Judgment normal.      Assessment:  [name] is a 67 y.o. male  who presents today for Colonoscopy for Personal history of colon polyps .  Plan:  Colonoscopy with possible intervention today  Colonoscopy with possible biopsy, control of bleeding, polypectomy, and interventions as necessary has been discussed with the patient/patient representative. Informed consent was obtained from the patient/patient representative after explaining the indication, nature, and risks of the procedure including but not limited to death, bleeding, perforation, missed neoplasm/lesions, cardiorespiratory compromise, and reaction to medications. Opportunity for questions was given and appropriate answers were provided. Patient/patient representative has verbalized understanding is amenable to undergoing the procedure.   Francisco Collins, DO  [facility] Gastroenterology  Portions of the record may have been created with voice recognition software. Occasional wrong-word or 'sound-a-like' substitutions may have occurred due to the inherent limitations of voice recognition  software.  Read the chart carefully and recognize, using context, where substitutions may have occurred.

## 2023-04-27 NOTE — Interval H&P Note (Signed)
 History and Physical Interval Note: Preprocedure H&P from 04/27/23  was reviewed and there was no interval change after seeing and examining the patient.  Written consent was obtained from the patient after discussion of risks, benefits, and alternatives. Patient has consented to proceed with Colonoscopy with possible intervention   04/27/2023 10:42 AM  Francisco Freeman  has presented today for surgery, with the diagnosis of D36.9 (ICD-10-CM) - Multiple adenomatous polyps.  The various methods of treatment have been discussed with the patient and family. After consideration of risks, benefits and other options for treatment, the patient has consented to  Procedure(s): COLONOSCOPY WITH PROPOFOL (N/A) as a surgical intervention.  The patient's history has been reviewed, patient examined, no change in status, stable for surgery.  I have reviewed the patient's chart and labs.  Questions were answered to the patient's satisfaction.     [name]

## 2023-04-27 NOTE — OR Nursing (Signed)
 Dr. Joelene Millin alerted that 12-lead EKG is completed. Dr. Timothy Lasso referring Pt to cardio outpatient. Pt BP back to pre-op level. Currently in A-flutter, new finding

## 2023-04-28 ENCOUNTER — Encounter: Payer: Self-pay | Admitting: Gastroenterology

## 2023-04-28 LAB — SURGICAL PATHOLOGY

## 2023-05-05 ENCOUNTER — Other Ambulatory Visit: Payer: Self-pay | Admitting: Internal Medicine

## 2023-05-05 DIAGNOSIS — I480 Paroxysmal atrial fibrillation: Secondary | ICD-10-CM

## 2023-05-09 ENCOUNTER — Ambulatory Visit
Admission: RE | Admit: 2023-05-09 | Discharge: 2023-05-09 | Disposition: A | Discharge status: Home / Self Care | Payer: Self-pay | Source: Ambulatory Visit | Attending: Internal Medicine | Admitting: Internal Medicine

## 2023-05-09 DIAGNOSIS — I480 Paroxysmal atrial fibrillation: Secondary | ICD-10-CM | POA: Insufficient documentation

## 2023-06-21 ENCOUNTER — Ambulatory Visit: Admitting: General Practice

## 2023-06-21 ENCOUNTER — Encounter: Payer: Self-pay | Admitting: Internal Medicine

## 2023-06-21 ENCOUNTER — Ambulatory Visit
Admission: RE | Admit: 2023-06-21 | Discharge: 2023-06-21 | Disposition: A | Discharge status: Home / Self Care | Attending: Internal Medicine | Admitting: Internal Medicine

## 2023-06-21 ENCOUNTER — Encounter
Admission: RE | Disposition: A | Discharge status: Home / Self Care | Payer: Self-pay | Source: Home / Self Care | Attending: Internal Medicine

## 2023-06-21 DIAGNOSIS — Z79899 Other long term (current) drug therapy: Secondary | ICD-10-CM | POA: Diagnosis not present

## 2023-06-21 DIAGNOSIS — I4891 Unspecified atrial fibrillation: Secondary | ICD-10-CM | POA: Insufficient documentation

## 2023-06-21 DIAGNOSIS — E66813 Obesity, class 3: Secondary | ICD-10-CM | POA: Diagnosis not present

## 2023-06-21 DIAGNOSIS — I4819 Other persistent atrial fibrillation: Secondary | ICD-10-CM | POA: Diagnosis not present

## 2023-06-21 DIAGNOSIS — G4730 Sleep apnea, unspecified: Secondary | ICD-10-CM | POA: Insufficient documentation

## 2023-06-21 DIAGNOSIS — Z6841 Body Mass Index (BMI) 40.0 and over, adult: Secondary | ICD-10-CM | POA: Diagnosis not present

## 2023-06-21 DIAGNOSIS — I10 Essential (primary) hypertension: Secondary | ICD-10-CM | POA: Insufficient documentation

## 2023-06-21 DIAGNOSIS — K219 Gastro-esophageal reflux disease without esophagitis: Secondary | ICD-10-CM | POA: Diagnosis not present

## 2023-06-21 DIAGNOSIS — Z01812 Encounter for preprocedural laboratory examination: Secondary | ICD-10-CM | POA: Diagnosis not present

## 2023-06-21 HISTORY — PX: CARDIOVERSION: SHX1299

## 2023-06-21 SURGERY — CARDIOVERSION
Anesthesia: General

## 2023-06-21 MED ORDER — SODIUM CHLORIDE 0.9 % IV SOLN: INTRAVENOUS | Status: DC

## 2023-06-21 MED ORDER — LIDOCAINE HCL (CARDIAC) PF 100 MG/5ML IV SOSY
PREFILLED_SYRINGE | INTRAVENOUS | Status: DC | PRN
  Administered 2023-06-21: 40 mg via INTRAVENOUS

## 2023-06-21 MED ORDER — PROPOFOL 1000 MG/100ML IV EMUL
INTRAVENOUS | Status: AC
  Filled 2023-06-21: qty 100

## 2023-06-21 MED ORDER — PROPOFOL 10 MG/ML IV BOLUS
INTRAVENOUS | Status: DC | PRN
  Administered 2023-06-21: 70 mg via INTRAVENOUS

## 2023-06-21 NOTE — Anesthesia Preprocedure Evaluation (Signed)
 Anesthesia Evaluation  Patient identified by MRN, date of birth, ID band Patient awake    Reviewed: Allergy & Precautions, NPO status , Patient's Chart, lab work & pertinent test results  History of Anesthesia Complications Negative for: history of anesthetic complications  Airway Mallampati: III  TM Distance: >3 FB Neck ROM: full    Dental no notable dental hx.    Pulmonary sleep apnea and Continuous Positive Airway Pressure Ventilation    Pulmonary exam normal        Cardiovascular hypertension, On Medications + dysrhythmias Atrial Fibrillation      Neuro/Psych negative neurological ROS  negative psych ROS   GI/Hepatic Neg liver ROS,GERD  ,,  Endo/Other    Class 3 obesity  Renal/GU      Musculoskeletal   Abdominal   Peds  Hematology negative hematology ROS (+)   Anesthesia Other Findings Past Medical History: No date: Arthritis     Comment:  Gout No date: B12 deficiency No date: Chronic kidney disease No date: Edema No date: Gout No date: History of kidney stones No date: Hypertension No date: Prostate cancer (HCC) No date: Recurrent prostate cancer (HCC) No date: Sleep apnea No date: Thrombocytopenia (HCC)  Past Surgical History: 11/12/2021: BALLOON DILATION; N/A     Comment:  Procedure: OPTILUME BALLOON DILATION;  Surgeon: Lawerence Pressman, MD;  Location: [facility] ORS;  Service: Urology;                Laterality: N/A; No date: COLONOSCOPY 12/28/2016: COLONOSCOPY; N/A     Comment:  Procedure: COLONOSCOPY;  Surgeon: Cassie Click, MD;              Location: [facility] ENDOSCOPY;  Service: Endoscopy;                Laterality: N/A; 07/15/2022: COLONOSCOPY WITH PROPOFOL ; N/A     Comment:  Procedure: COLONOSCOPY WITH PROPOFOL ;  Surgeon: Quintin Buckle, DO;  Location: [facility] ENDOSCOPY;  Service:               Gastroenterology;  Laterality: N/A; 11/12/2021: CYSTOSCOPY WITH  RETROGRADE URETHROGRAM; N/A     Comment:  Procedure: CYSTOSCOPY WITH RETROGRADE URETHROGRAM;                Surgeon: Lawerence Pressman, MD;  Location: [facility] ORS;                Service: Urology;  Laterality: N/A; 07/15/2022: POLYPECTOMY     Comment:  Procedure: POLYPECTOMY;  Surgeon: Quintin Buckle,              DO;  Location: [facility] ENDOSCOPY;  Service:               Gastroenterology;; No date: PROSTATE SURGERY     Comment:  robot assisted prostatectomy 01/24/2006: PROSTATECTOMY No date: TONSILLECTOMY     Reproductive/Obstetrics negative OB ROS                             Anesthesia Physical Anesthesia Plan  ASA: 3  Anesthesia Plan: General   Post-op Pain Management: Minimal or no pain anticipated   Induction: Intravenous  PONV Risk Score and Plan: 3 and Propofol  infusion, TIVA and Ondansetron   Airway Management Planned: Nasal Cannula  Additional Equipment: None  Intra-op Plan:  Post-operative Plan:   Informed Consent: I have reviewed the patients History and Physical, chart, labs and discussed the procedure including the risks, benefits and alternatives for the proposed anesthesia with the patient or authorized representative who has indicated his/her understanding and acceptance.     Dental advisory given  Plan Discussed with: CRNA and Surgeon  Anesthesia Plan Comments: (Discussed risks of anesthesia with patient, including possibility of difficulty with spontaneous ventilation under anesthesia necessitating airway intervention, PONV, and rare risks such as cardiac or respiratory or neurological events, and allergic reactions. Discussed the role of CRNA in patient's perioperative care. Patient understands.)       Anesthesia Quick Evaluation

## 2023-06-21 NOTE — Transfer of Care (Signed)
 Immediate Anesthesia Transfer of Care Note  Patient: Francisco Freeman  Procedure(s) Performed: CARDIOVERSION  Patient Location: PACU and Cath Lab  Anesthesia Type:General  Level of Consciousness: awake  Airway & Oxygen Therapy: Patient Spontanous Breathing and Patient connected to nasal cannula oxygen  Post-op Assessment: Report given to RN and Post -op Vital signs reviewed and stable  Post vital signs: Reviewed and stable  Last Vitals:  Vitals Value Taken Time  BP 103/72 06/21/23 0748  Temp    Pulse 66 06/21/23 0750  Resp 14 06/21/23 0750  SpO2 96 % 06/21/23 0750  Vitals shown include unfiled device data.  Last Pain:  Vitals:   06/21/23 0656  TempSrc: Oral  PainSc: 0-No pain         Complications: No notable events documented.

## 2023-06-21 NOTE — Anesthesia Procedure Notes (Signed)
 Procedure Name: MAC Date/Time: 06/21/2023 7:31 AM  Performed by: Donnamae Gaba, CRNAPre-anesthesia Checklist: Patient identified, Emergency Drugs available, Suction available, Patient being monitored and Timeout performed Patient Re-evaluated:Patient Re-evaluated prior to induction Oxygen Delivery Method: Supernova nasal CPAP Induction Type: IV induction Placement Confirmation: positive ETCO2 and CO2 detector

## 2023-06-22 ENCOUNTER — Encounter: Payer: Self-pay | Admitting: Internal Medicine

## 2023-06-22 NOTE — Anesthesia Postprocedure Evaluation (Signed)
 Anesthesia Post Note  Patient: Francisco Freeman  Procedure(s) Performed: CARDIOVERSION  Patient location during evaluation: PACU Anesthesia Type: General Level of consciousness: awake and alert Pain management: pain level controlled Vital Signs Assessment: post-procedure vital signs reviewed and stable Respiratory status: spontaneous breathing, nonlabored ventilation, respiratory function stable and patient connected to nasal cannula oxygen Cardiovascular status: blood pressure returned to baseline and stable Postop Assessment: no apparent nausea or vomiting Anesthetic complications: no  No notable events documented.   Last Vitals:  Vitals:   06/21/23 0815 06/21/23 0830  BP: 111/82 116/84  Pulse: 60 (!) 58  Resp: 13 12  Temp:    SpO2: 92% 93%    Last Pain:  Vitals:   06/21/23 0830  TempSrc:   PainSc: 0-No pain                 Enrique Harvest

## 2023-07-10 NOTE — CV Procedure (Signed)
 Electrical Cardioversion Procedure Note   Procedure: Electrical Cardioversion Indications:  Atrial Fibrillation  Procedure Details Consent: Risks of procedure as well as the alternatives and risks of each were explained to the (patient/caregiver).  Consent for procedure obtained. Time Out: Verified patient identification, verified procedure, site/side was marked, verified correct patient position, special equipment/implants available, medications/allergies/relevent history reviewed, required imaging and test results available.  Performed  Patient placed on cardiac monitor, pulse oximetry, supplemental oxygen as necessary.  Sedation given: Propofol  as per anesthesia Pacer pads placed anterior and posterior chest.  Cardioverted 2 time(s).  Cardioverted at 150J.  Evaluation Findings: Post procedure EKG shows: NSR Complications: None Patient did tolerate procedure well.  Burney Carter MD 06/21/2023 07:40

## 2023-09-13 NOTE — Progress Notes (Addendum)
 Referring Provider: Debby Franky Jacobsen, MD [address] [facility] [address]2G [facility] [address] [place],  [place] [address]  Primary Care Provider: Fernande Ophelia Marinell DOUGLAS, MD [address] [facility] [place] [address]   Patient Identification: DECLIN RAJAN is a 68 y.o. male here for consultation of his atrial fibrillation and possible left atrial occlusion procedure.    History   History of Present Illness JAXYN ROUT is a 68 year old male with atrial fibrillation who presents for discussion of the Watchman device.  He has atrial fibrillation and a history of gastrointestinal bleeding, which required hospitalization last year. The cause of the bleeding was not determined, and it has not recurred since. He is currently on a generic form of Pradaxa, having switched from Eliquis due to cost concerns.  He underwent cardioversion previously and has not experienced any problems since. Sleeping on his right side improves his sleep quality, whereas sleeping on his left side sometimes results in a faster heartbeat. He uses a CPAP machine for sleep apnea, which influences his sleeping position.  He has a history of prostate cancer, initially diagnosed at age 6, treated with surgery and later with radiation. His PSA levels have recently risen to 7.3, although PSMA PET scans have not shown any evidence of cancer recurrence. He is concerned about the rising PSA levels despite clear scans.  He lives alone and has limited family support, with only one brother who does not live nearby. He is concerned about arranging transportation for medical procedures.   Problem List   Specialty Problems       Cardiology Problems   Hypertension      Paroxysmal atrial fibrillation (CMS/HHS-HCC)      Patient Active Problem List  Diagnosis  . Hypertension  . Gout  . Recurrent prostate cancer (CMS/HHS-HCC)  . FH: colon cancer  . OSA (obstructive sleep apnea)  . B12 deficiency  .  Stage 3a chronic kidney disease (CMS/HHS-HCC)  . BMI 40.0-44.9, adult (CMS/HHS-HCC)  . Thrombocytopenia ()  . Paroxysmal atrial fibrillation (CMS/HHS-HCC)    Medications   Current Outpatient Medications  Medication Sig Dispense Refill  . apixaban (ELIQUIS) 5 mg tablet Take 1 tablet (5 mg total) by mouth every 12 (twelve) hours 30 tablet 3  . atenoloL  (TENORMIN ) 25 MG tablet Take 1 tablet (25 mg total) by mouth once daily (Patient taking differently: Take 12.5 mg by mouth once daily) 90 tablet 3  . chlorthalidone 25 MG tablet Take 1 tablet (25 mg total) by mouth once daily 90 tablet 0  . cholecalciferol (VITAMIN D3) 2,000 unit capsule Take 5,000 Units by mouth once daily Patient takes everyday    . cyanocobalamin  (VITAMIN B12) 1000 MCG tablet Take 1,000 mcg by mouth once daily    . dabigatran (PRADAXA) 150 mg capsule Take 1 capsule (150 mg total) by mouth 2 (two) times daily 180 capsule 3  . doxazosin  (CARDURA ) 8 MG tablet Take 1 tablet (8 mg total) by mouth at bedtime 90 tablet 1  . FUROsemide (LASIX) 40 MG tablet TAKE ONE TABLET BY MOUTH ONE TIME DAILY AS NEEDED FOR EDEMA 90 tablet 3  . losartan  (COZAAR ) 100 MG tablet Take 1 tablet (100 mg total) by mouth once daily 90 tablet 3  . MAGNESIUM GLYCINATE ORAL Take by mouth as directed Biglycinate, 3 tablets daily    . potassium chloride  (KLOR-CON ) 10 MEQ ER tablet TAKE ONE TABLET BY MOUTH ONE TIME DAILY AS NEEDED WITH LASIX 90 tablet 3  No current facility-administered medications for this visit.    An updated, reconciled list of all medications, including OTC and herbal, was reviewed and a copy of the updated list was provided to the patient. Treatment plan and medications were discussed with the patient who voiced understanding. No barriers to learning.  Allergies   Allergies  Allergen Reactions  . Bisoprolol-Hydrochlorothiazide Syncope    REACTION: Syncope  . Amlodipine Besylate Other (See Comments)    REACTION: felt bad  .  Clonidine Other (See Comments)    Raised blood pressure  . Hydralazine Other (See Comments)    Caused hot flashes   . Hydrochlorothiazide Other (See Comments)    gout  . Irbesartan Other (See Comments)    REACTION: BP elevated  . Zestril [Lisinopril] Cough    Review of Systems   GENERAL HEALTH []  Reduced acitivity []  Appetite change []  Chills or fever []  Fatigue []  Unexpected weight gain/loss  EYES, EARS, NOSE, THROAT []  Dental problem []  Ear pain/ringing []  Hearing loss []  Facial swelling []  Nose bleeds []  Vision changes []  Trouble swallowing  RESPIRATORY []  Sleep apnea []  Snoring []  Sputum []  Cough []  Chest tightness []  Shortness of breath []  Wheezing  CARDIOVASCULAR []  Chest pain or tightness []  Leg swelling []  Palpitations/heart flutters []  Waking up short of breath []  Sleeping on an incline/more than one pillow []  Leg pain when walking []  Sores on feet/legs []  Pain in feet at night  GASTROINTESTINAL []  Abdominal bloating/swelling []  Abdominal pain []  Red blood on toilet tissue or in bowl []  Blood in stool []  Constipation []  Diarrhea []  Nausea [] Vomiting URINARY [x]  Difficulty urinating []  Flank pain [x]  Frequent urination []  Blood in urine []  Nocturia need to urinate more then 1 time at night []  Decreased amount of urine  MUSCULOSKELETAL []  Joint aches that limit exercise [x]  Back pain that limits exercise []  Muscle aches that limit exercise  SKIN []  Color change []  Rash []  Wound  NEUROLOGIC []  Dizziness (room spinning) []  Headache []  Lightheadedness []  Numbness []  Weakness []  Speech difficulty []  Passing out []  Facial droop  HEMATOLOGIC []  Bruises/bleeds easily  PSYCHIATRIC []  Anxiety []  Confusion []  Depression [x]  Sleep disturbance [x]   Memory trouble  ENDOCRINE []  Thyroid problems    Review of systems otherwise negative except as indicated below in the interval history.  Physical Exam   Vitals: BP 124/80 (BP  Location: Left upper arm, Patient Position: Sitting, BP Cuff Size: Large Adult)   Pulse 58   Resp 14   Ht 177.8 cm (5' 10)   Wt (!) 144.7 kg (319 lb)   SpO2 95%   BMI 45.77 kg/m   BMI: Body mass index is 45.77 kg/m.  General Appearance:  Appears well, no distress; Alert and oriented x 3  Neck: Carotids 2+ bilaterally, no carotid bruits, JVD is flat  Lungs:   clear to auscultation, without rales or wheeze, good air exchange  Heart:  normal rate and regular rhythm  Abdomen:   Soft, non-tender, bowel sounds active,  no masses, no organomegaly  Extremities: Extremities normal, no cyanosis. 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         ECHOCARDIOGRAPHIC DESCRIPTIONS ----------------------------------------------------------- AORTIC ROOT         Asc Ao Size: DILATED                               Dissection: INDETERMINATE FOR DISSECTION                                                AORTIC VALVE            Leaflets: Tricuspid                               Mobility: Fully Mobile                     Morphology: Normal                                                                                     AR: No AR                                         AS: No AS                               AV Mass: No Masses                      LEFT VENTRICLE                Size: Normal                                                                                     LVH: MILD LVH CONCENTRIC                  Contraction: Normal                                Closest EF: >55%                                                       LV Mass: No Masses  Gabe Nest: can't be determined                                                         WALL MOTION                            Basal             Mid               Apical               Anterior Septum: Normal            Normal            Normal                 Anterior Wall: Normal            Normal            Normal                  Lateral Wall: Normal            Normal            Normal                Posterior Wall: Normal            Normal                                   Inferior Wall: Normal            Normal            Normal               Inferior Septum: Normal            Normal                               Rest Rest Score Index: 1.00     MITRAL VALVE            Leaflets: Normal                                  Mobility: Fully Mobile                     Morphology: Normal                                                                            Resting LVOT: 0.8 m/s  MR: MILD MR                                       MS: No MS                             MV masses: No Masses                      LEFT ATRIUM                Size: MILDLY ENLARGED                                                                     LA masses: No Masses                      MAIN PA                Size: Not Seen                       PULMONIC VALVE            Leaflets: UNKNOWN                                 Mobility: Fully Mobile                     Morphology: Normal                                                            PR: TRIVIAL PR                                    PS: No PS                             PV masses: No Masses                      RIGHT VENTRICLE                Size: Normal                                  Free Wall: Normal                          Contraction: Normal  RV masses: No Masses                      TRICUSPID VALVE            Leaflets: Normal                                  Mobility: Fully Mobile                     Morphology: Normal                                        TR: MILD TR                                       TS: No TS                             TV masses: No Masses                      RIGHT ATRIUM                Size: ENLARGED                               RA masses: No Masses                      PERICARDIUM               Fluid: TRIVIAL FLUID        POSTERIOR                      FIBRIN     INFERIOR VENA CAVA                Size: Normal                             Resp.Collapse: Normal Respiratory Collapse                                             RESTING ECHOCARDIOGRAPHIC MEASUREMENTS --------------------------------------------------- AORTA Measurements            Values    Units     Normal Range                               Aorta Sin: 4.1       cm        [2.8 - 4.0]                              ST Junction: 2.6       cm        [2.3 - 3.5]  Asc.Aorta: 3.4       cm        [2.2 - 3.8]                           Asc. Aorta BSA: 1.4       cm/m2     [1.1 - 1.9]                     LEFT VENTRICLE                  LVIDd: 5.5       cm        [4.2 - 5.8]                                    LVIDs: 3.9       cm        [2.5- 4]                                   LVIDd/BSA: 2.2       cm/m2                                                      SWT: 1.1       cm        [0.6 - 1]                                        PWT: 0.8       cm        [0.6 - 1]                       DIASTOLIC FUNCTION          MV Pk. E Vel.: 109       cm/s                                                     MV DT: 166       msec                                      LEFT ATRIUM                 LA Diam: 4.6       cm        [3 - 4]                                      LA Area: 33.5      cm2       [<= 20]  LA Volume: 95        ml        [18 - 58]                                       LAVi: 37        ml/m2     [16 - 34]                       RIGHT VENTRICLE                 RV Mid: 3.3       cm        [1.9 - 3.5]                     RIGHT ATRIUM                RA Area: 26.1      cm2       [ <= 20]                                   RA Volume: 98        mL                                                        RAVi: 39        mL/m2     [11 - 39]                       Pressures, Gradients, and DOPPLER ECHO --------------------------------------------------- Aortic Valve            AV Pk. Vel.: 1.2       m/s                                               AV Pk. Grad.: 6         mmHg                                              AV Mn. Grad.: 3         mmHg                                                    AV VTI: 21.5      cm                                                    LVOT VTI: 14.9      cm  Resting LVOT Vel: 0.8       m/s                                             LVOT Pk. Grad.: 3         mmHg                                            LVOT Mn. Grad.: 1         mmHg                                                 LVOT Diam: 2         cm                                                   LVOT Area: 3.2       cm2                                                    LVOT SV: 47        mL                                                    LVOT SVi: 19        mL/m2                                            AVA Cont. VTI: 2.2       cm2       by DOPPLER                               AVAi to BSA: 0.87      cm/m2                                               Dim. Index: 0.67                                                Mitral Valve                MVA PHT: 4.5       cm2       by  DOPPLER                              MV Pk. E Vel.: 109       cm/s                                          MV Inflow E Vel.: 109       cm/s                                      Tricuspid Regurgitation Values            TR Pk. Vel.: 2.7       m/s                                                RA Pressure: 3         mmHg                                                      RVSP: 31        mmHg      Peak      Labs    Hematology Lab Results  Component Value Date   WBC 7.0 05/24/2023   HGB 17.6 05/24/2023   HCT 52.1 (H) 05/24/2023   MCV 87.6 05/24/2023   PLT 172 05/24/2023    Chemistries Lab Results  Component Value Date   CREATININE 1.3 05/24/2023   BUN 17 05/24/2023   NA 141 05/24/2023   K 4.3 05/24/2023   CL 106 05/24/2023   CO2 25.9 05/24/2023    Liver Function Studies Lab Results  Component Value Date   ALT 19 05/24/2023   AST 17 05/24/2023   ALKPHOS 69 05/24/2023    Thyroid Function Studies Lab Results  Component Value Date   TSH 3.209 05/24/2023    INR No results found for: INR  Assessment & Plan Atrial fibrillation with consideration of left atrial appendage occlusion and ablation Atrial fibrillation managed with Pradaxa. Discussed Watchman device for left atrial appendage occlusion with <1% dislodgement risk. Considered concurrent ablation with Watchman. Pulse field ablation has ~20-30% post-ablation AFib recurrence rate. He has a normal EF, NVAF and enlarged LA. - Consider Watchman device for left atrial appendage occlusion. - Consider pulse field ablation for atrial fibrillation. - Provided educational video on Watchman and ablation procedures. - Allowed time for him to consider options and discuss with family. - Schedule procedure for early October if he decides to proceed. -RF modification especially weight loss for afib management History of gastrointestinal bleeding in the context of anticoagulation management Qualifies for Watchman device due to recurrent  bleeding risk. - Discuss risks and benefits of continuing anticoagulation therapy versus Watchman device.  Pre-procedure Details - Atrial Fibrillation Ablation with concomitant Left Atrial Appendage Occlusion AF Ablation Indication: In patients with symptomatic AF in whom antiarrhythmic drugs have been ineffective, contraindicated, not tolerated or not preferred, and continued  rhythm control is desired, catheter ablation is useful to improve symptoms. (COR 1; LOE A) Base procedure: CPT 93656 - Comprehensive EP evaluation and treatment of Atrial Fibrillation, Pulmonary Vein Isolation - AND - CPT 33340 - Percutaneous transcatheter closure of the left atrial appendage with implant, including fluoroscopy transseptal puncture, catheter placements), left atrial angiography, left atrial appendage angiography, radiological supervision, and interpretation Intracardiac Echocardiography: yes NU Vision 3D ice Intracardiac 3-D Mapping: Medtronic Affera Ablation Modality: Medtronic Affera Referring MD: See note header Primary Cardiologist: See note header Primary Electrophysiologist: Debby Procedure Date: TBD Medically necessary for first case: No Procedure Location: [facility] Patient preference to move up procedure date if available due to cancellation: No  Follow-up appointment: As scheduled above Admission Status: transferred to the observation unit Procedure Status: elective Anesthesia Plan: General anesthesia Urinary Catheter: No urinary catheter Radial arterial line: No The patient will not require a pre-procedure TEE within 72h of procedure to evaluate for thrombus. Pre-procedure Imaging: A cardiac CT scan will be obtained to evaluate the morphology and function of the left atrium, right atrium, and coronary sinus as well as to evaluate the pulmonary vasculature for use with an electro-anatomical mapping system. For MRI: No cardiac implantable electronic device present. Preoperative  Evaluation: Clinic 1D - Preop Visit Needed  Preoperative labs: To be done at pre-operative Visit Basic Metabolic Profile, CBC, PT with INR, aPTT, Magnesium, and Type & Screen Contrast Allergy Premedication: No premedication necessary  Pre-ablation medication plans: Anticoagulant Plan - for all direct oral anticoagulants (apixaban, edoxaban, dabigatran, rivaroxaban) do not take AM dose on the day of the procedure dabigatran give last dose PM one calendar day prior to procedure Antiarrhythmic Plan Not on antiarrhythmic medications Hypoglycemic medications protocol If on metformin hold on day of ablation and day of TEE if a TEE is being done If on glipizide or glyburide hold on day of ablation and day of TEE if a TEE is being done If on evening long or intermediate-acting insulin give half dose one calendar day in evening before the ablation If on morning long or intermediate-acting insulin give half dose on day of the ablation GLP-1 and GLP-1/GIP agonists - If the patient is on a GLP-1 agonist (eg. semaglutide, dulaglutide, etc.) or a GLP-1/GIP agonist (tirzepatide) please have patient take last dose > 5 days prior to the procedure and maintain a clear liquid diet the day prior to the procedure. Diuretics - if on diuretic, hold day of procedure ACE inhibitors / Angiotensin receptor blockers - if taking an ACEi or an ARB, hold the AM dose on the day of the procedure AV nodal Medications Beta-blockers - do not hold Diltiazem  - do not hold Additional medications to hold None Post-ablation medication plan is: Anti-arrhythmic drug: none \\Anti -coagulation:  restart prior anticoagulant for 3 months post procedure Colchicine: No Proton pump inhibitor q12h:  No  Order CT prior to the procedure no contrast allergy creatinine 1.3 with a GFR of 61 April 2025 ICE for device implantation with biosense webster please schedule  Last dose of pradaxa the evening prior to the procedure.  Postprocedural  antithrombotic therapy will be Pradaxa for 90 days followed by aspirin only thereafter.  TEE 3 months post procedure.     --------------------------------------------- Franky Debby, MD Clinical Cardiac Electrophysiology [facility] Office: [phone] Fax: [phone]  I confirm that I was present for the key and critical portions of the service, including a review of the patient's history and laboratory data. I personally examined the  patient, and formulated the evaluation and/or treatment plan.  I spent a total of 41 minutes in both face-to-face and non-face-to-face activities for this visit on the date of this encounter.

## 2023-09-30 ENCOUNTER — Emergency Department
Admission: EM | Admit: 2023-09-30 | Discharge: 2023-09-30 | Disposition: A | Discharge status: Home / Self Care | Attending: Emergency Medicine | Admitting: Emergency Medicine

## 2023-09-30 ENCOUNTER — Emergency Department

## 2023-09-30 ENCOUNTER — Other Ambulatory Visit: Payer: Self-pay

## 2023-09-30 DIAGNOSIS — Z8546 Personal history of malignant neoplasm of prostate: Secondary | ICD-10-CM | POA: Diagnosis not present

## 2023-09-30 DIAGNOSIS — R002 Palpitations: Secondary | ICD-10-CM | POA: Diagnosis present

## 2023-09-30 DIAGNOSIS — I10 Essential (primary) hypertension: Secondary | ICD-10-CM | POA: Diagnosis not present

## 2023-09-30 DIAGNOSIS — Z7901 Long term (current) use of anticoagulants: Secondary | ICD-10-CM | POA: Insufficient documentation

## 2023-09-30 DIAGNOSIS — I4891 Unspecified atrial fibrillation: Secondary | ICD-10-CM | POA: Insufficient documentation

## 2023-09-30 LAB — CBC
HCT: 49.5 % (ref 39.0–52.0)
Hemoglobin: 17 g/dL (ref 13.0–17.0)
MCH: 30.5 pg (ref 26.0–34.0)
MCHC: 34.3 g/dL (ref 30.0–36.0)
MCV: 88.7 fL (ref 80.0–100.0)
Platelets: 170 K/uL (ref 150–400)
RBC: 5.58 MIL/uL (ref 4.22–5.81)
RDW: 14.3 % (ref 11.5–15.5)
WBC: 8.5 K/uL (ref 4.0–10.5)
nRBC: 0 % (ref 0.0–0.2)

## 2023-09-30 LAB — BASIC METABOLIC PANEL WITH GFR
Anion gap: 12 (ref 5–15)
BUN: 39 mg/dL — ABNORMAL HIGH (ref 8–23)
CO2: 22 mmol/L (ref 22–32)
Calcium: 8.9 mg/dL (ref 8.9–10.3)
Chloride: 103 mmol/L (ref 98–111)
Creatinine, Ser: 1.45 mg/dL — ABNORMAL HIGH (ref 0.61–1.24)
GFR, Estimated: 53 mL/min — ABNORMAL LOW (ref >60)
Glucose, Bld: 114 mg/dL — ABNORMAL HIGH (ref 70–99)
Potassium: 3.4 mmol/L — ABNORMAL LOW (ref 3.5–5.1)
Sodium: 137 mmol/L (ref 135–145)

## 2023-09-30 LAB — TROPONIN I (HIGH SENSITIVITY)
Troponin I (High Sensitivity): 21 ng/L — ABNORMAL HIGH (ref <18)
Troponin I (High Sensitivity): 23 ng/L — ABNORMAL HIGH (ref <18)

## 2023-09-30 LAB — MAGNESIUM: Magnesium: 1.8 mg/dL (ref 1.7–2.4)

## 2023-09-30 MED ORDER — DILTIAZEM HCL 60 MG PO TABS
60.0000 mg | ORAL_TABLET | Freq: Once | ORAL | Status: AC
  Administered 2023-09-30: 60 mg via ORAL
  Filled 2023-09-30: qty 1

## 2023-09-30 MED ORDER — SODIUM CHLORIDE 0.9 % IV BOLUS
500.0000 mL | Freq: Once | INTRAVENOUS | Status: AC
  Administered 2023-09-30: 500 mL via INTRAVENOUS

## 2023-09-30 MED ORDER — DILTIAZEM HCL 25 MG/5ML IV SOLN
10.0000 mg | Freq: Once | INTRAVENOUS | Status: AC
  Administered 2023-09-30: 10 mg via INTRAVENOUS
  Filled 2023-09-30: qty 5

## 2023-09-30 NOTE — ED Notes (Signed)
 Answered call light, pt ambulated to bed after using the bathroom, connected pt back to monitor, no further needs at this time.

## 2023-09-30 NOTE — ED Notes (Signed)
 ED Provider at bedside.

## 2023-09-30 NOTE — ED Notes (Signed)
Pt ambulated to toilet in room without difficulty.

## 2023-09-30 NOTE — ED Triage Notes (Signed)
 Pt reports that he feels like his heart rate has been high for several hours, he has history of AFIB. He c/o some mild SOB and some pressure in the back of his head. He is on blood thinners. Afib, HR 90-125 during triage. Due to have an ablation in October

## 2023-09-30 NOTE — ED Notes (Signed)
 Pt roomed from triage... c/o afib RVR and sob, denies sob. Pt on cardiac monitor, NADN. Call light within reach. Denies needs at this time.

## 2023-09-30 NOTE — Discharge Instructions (Signed)
 Call your cardiologist in the morning regarding your episode of atrial fibrillation with fast heart rate tonight.  I think that it is reasonable to start your atenolol  back up to your previous dose of 25 mg daily to help better control your heart rate.  If your heart doctor tells you otherwise, please follow his or heard instructions.  Thank you for choosing us  for your health care today!  Please see your primary doctor this week for a follow up appointment.   If you have any new, worsening, or unexpected symptoms call your doctor right away or come back to the emergency department for reevaluation.  It was my pleasure to care for you today.   Ginnie EDISON Cyrena, MD

## 2023-09-30 NOTE — ED Provider Notes (Signed)
 [facility] Provider Note    Event Date/Time   First MD Initiated Contact with Patient 09/30/23 [id number]     (approximate)   History   Shortness of Breath   HPI  Francisco Freeman is a 68 y.o. male   Past medical history of atrial fibrillation on anticoagulation, prostate cancer, hypertension, presents emergency department with palpitations feels like his heart is beating fast.  He has felt this way when his atrial fibrillation with RVR has kicked up in the past.  He denies chest pain or shortness of breath.  Has been fully compliant with all medications.  He notes that his atenolol  was cut in half to 12.5 mg daily about a year ago and has been fully compliant with this medication as well as his anticoagulation as well.  He denies any other acute medical complaints.   He states that he does have an ablation appointment coming up in October  External Medical Documents Reviewed: Previous cardiology notes      Physical Exam   Triage Vital Signs: ED Triage Vitals  Encounter Vitals Group     BP 09/30/23 0131 (!) 150/96     Girls Systolic BP Percentile --      Girls Diastolic BP Percentile --      Boys Systolic BP Percentile --      Boys Diastolic BP Percentile --      Pulse Rate 09/30/23 0131 (!) 122     Resp 09/30/23 0131 18     Temp 09/30/23 0131 97.9 F (36.6 C)     Temp src --      SpO2 09/30/23 0131 95 %     Weight --      Height --      Head Circumference --      Peak Flow --      Pain Score 09/30/23 0132 4     Pain Loc --      Pain Education --      Exclude from Growth Chart --     Most recent vital signs: Vitals:   09/30/23 0415 09/30/23 0417  BP:    Pulse: 87   Resp: 15   Temp:  98.1 F (36.7 C)  SpO2: 98%     General: Awake, no distress.  CV:  Good peripheral perfusion.  Resp:  Normal effort.  Abd:  No distention.  Other:  Awake alert comfortable appearing with normal vital signs aside from his irregular heart rate  ranging from the high 90s to 120s.  Normal blood pressure.  Breathing comfortably on room air.  Clear lungs without focality wheezing or rales.  No peripheral edema noted on my exam.  Soft benign abdominal exam.   ED Results / Procedures / Treatments   Labs (all labs ordered are listed, but only abnormal results are displayed) Labs Reviewed  BASIC METABOLIC PANEL WITH GFR - Abnormal; Notable for the following components:      Result Value   Potassium 3.4 (*)    Glucose, Bld 114 (*)    BUN 39 (*)    Creatinine, Ser 1.45 (*)    GFR, Estimated 53 (*)    All other components within normal limits  TROPONIN I (HIGH SENSITIVITY) - Abnormal; Notable for the following components:   Troponin I (High Sensitivity) 21 (*)    All other components within normal limits  CBC  MAGNESIUM  TROPONIN I (HIGH SENSITIVITY)     I ordered and reviewed the above labs  they are notable for cell counts electrolytes largely unremarkable and initial troponin of 21.  EKG  ED ECG REPORT I, Ginnie Shams, the attending physician, personally viewed and interpreted this ECG.   Date: 09/30/2023  EKG Time: 0131  Rate: 112  Rhythm: AF RVR  Axis: nl  Intervals:nl  ST&T Change: no stemi    RADIOLOGY I independently reviewed and interpreted chest x-ray and I see no obvious focality or pneumothorax I also reviewed radiologist's formal read.   PROCEDURES:  Critical Care performed: Yes, see critical care procedure note(s)  .Critical Care  Performed by: Shams Ginnie, MD Authorized by: Shams Ginnie, MD   Critical care provider statement:    Critical care time (minutes):  30   Critical care was time spent personally by me on the following activities:  Development of treatment plan with patient or surrogate, discussions with consultants, evaluation of patient's response to treatment, examination of patient, ordering and review of laboratory studies, ordering and review of radiographic studies, ordering and  performing treatments and interventions, pulse oximetry, re-evaluation of patient's condition and review of old charts    MEDICATIONS ORDERED IN ED: Medications  sodium chloride  0.9 % bolus 500 mL (0 mLs Intravenous Stopped 09/30/23 0351)  diltiazem  (CARDIZEM ) injection 10 mg (10 mg Intravenous Given 09/30/23 0252)  diltiazem  (CARDIZEM ) tablet 60 mg (60 mg Oral Given 09/30/23 0358)    IMPRESSION / MDM / ASSESSMENT AND PLAN / ED COURSE  I reviewed the triage vital signs and the nursing notes.                                Patient's presentation is most consistent with acute presentation with potential threat to life or bodily function.  Differential diagnosis includes, but is not limited to, atrial fibrillation with RVR, ACS, electrolyte derangement, dehydration, CHF exacerbation   The patient is on the cardiac monitor to evaluate for evidence of arrhythmia and/or significant heart rate changes.  MDM:    Patient with a history of atrial fibrillation on anticoagulation and on rate control medications compliant with medications with no other acute medical illnesses or preceding events leading to his atrial fibrillation with RVR today.  No chest pain to suggest cardiac ischemia and nonischemic EKG I think less likely due to ACS, will follow serial troponins.  Doubt PE given full compliance with his anticoagulation.  Given largely asymptomatic other than feeling of fast heart rate, I doubt cardiopulmonary emergencies today other than his need for rate control.    He does have an appointment with a doctor for ablation upcoming in the next couple of weeks.  He was given 10 mg of IV diltiazem  with very good rate control now in the 70s to 90s.  Remained normotensive the whole time.  Will give an oral dose of diltiazem  to get him through the night.  I will have him restart his atenolol  at his previous dose of 25 mg daily and follow-up with his cardiologist.         FINAL CLINICAL IMPRESSION(S)  / ED DIAGNOSES   Final diagnoses:  Atrial fibrillation with RVR (HCC)     Rx / DC Orders   ED Discharge Orders     None        Note:  This document was prepared using Dragon voice recognition software and may include unintentional dictation errors.    Shams Ginnie, MD 09/30/23 [phone]

## 2023-09-30 NOTE — ED Notes (Signed)
 PT in no acute distress prior to discharge. Discharged instructions reviewed and pt stated that they understand directions. Pt has all belongings with them at time of discharge.

## 2023-12-13 ENCOUNTER — Other Ambulatory Visit: Payer: Self-pay | Admitting: Internal Medicine

## 2023-12-13 DIAGNOSIS — R911 Solitary pulmonary nodule: Secondary | ICD-10-CM

## 2023-12-13 DIAGNOSIS — C61 Malignant neoplasm of prostate: Secondary | ICD-10-CM

## 2023-12-14 ENCOUNTER — Ambulatory Visit
Admission: RE | Admit: 2023-12-14 | Discharge: 2023-12-14 | Disposition: A | Discharge status: Home / Self Care | Source: Ambulatory Visit | Attending: Internal Medicine | Admitting: Internal Medicine

## 2023-12-14 DIAGNOSIS — C61 Malignant neoplasm of prostate: Secondary | ICD-10-CM | POA: Insufficient documentation

## 2023-12-14 DIAGNOSIS — R911 Solitary pulmonary nodule: Secondary | ICD-10-CM | POA: Diagnosis present

## 2023-12-25 ENCOUNTER — Other Ambulatory Visit: Payer: Self-pay | Admitting: Internal Medicine

## 2023-12-25 DIAGNOSIS — R911 Solitary pulmonary nodule: Secondary | ICD-10-CM

## 2023-12-27 ENCOUNTER — Ambulatory Visit: Admission: RE | Admit: 2023-12-27 | Discharge: 2023-12-27 | Attending: Internal Medicine | Admitting: Internal Medicine

## 2023-12-27 DIAGNOSIS — R911 Solitary pulmonary nodule: Secondary | ICD-10-CM

## 2023-12-27 LAB — GLUCOSE, CAPILLARY: Glucose-Capillary: 98 mg/dL (ref 70–99)

## 2023-12-27 MED ORDER — FLUDEOXYGLUCOSE F - 18 (FDG) INJECTION
12.0000 | Freq: Once | INTRAVENOUS | Status: AC | PRN
  Administered 2023-12-27: 13.14 via INTRAVENOUS

## 2024-02-01 ENCOUNTER — Ambulatory Visit: Admission: RE | Admit: 2024-02-01 | Source: Home / Self Care | Admitting: Cardiology

## 2024-02-01 ENCOUNTER — Ambulatory Visit: Admitting: Anesthesiology

## 2024-02-01 ENCOUNTER — Encounter: Payer: Self-pay | Admitting: Cardiology

## 2024-02-01 ENCOUNTER — Encounter
Admission: RE | Disposition: A | Discharge status: Home / Self Care | Payer: Self-pay | Source: Home / Self Care | Attending: Cardiology

## 2024-02-01 ENCOUNTER — Ambulatory Visit
Admission: RE | Admit: 2024-02-01 | Discharge: 2024-02-01 | Disposition: A | Discharge status: Home / Self Care | Source: Home / Self Care | Attending: Student | Admitting: Student

## 2024-02-01 ENCOUNTER — Ambulatory Visit
Admission: RE | Admit: 2024-02-01 | Discharge: 2024-02-01 | Disposition: A | Discharge status: Home / Self Care | Attending: Cardiology | Admitting: Cardiology

## 2024-02-01 ENCOUNTER — Other Ambulatory Visit: Payer: Self-pay

## 2024-02-01 DIAGNOSIS — Z6841 Body Mass Index (BMI) 40.0 and over, adult: Secondary | ICD-10-CM | POA: Diagnosis not present

## 2024-02-01 DIAGNOSIS — Z7901 Long term (current) use of anticoagulants: Secondary | ICD-10-CM | POA: Diagnosis not present

## 2024-02-01 DIAGNOSIS — Z79899 Other long term (current) drug therapy: Secondary | ICD-10-CM | POA: Diagnosis not present

## 2024-02-01 DIAGNOSIS — E66813 Obesity, class 3: Secondary | ICD-10-CM | POA: Insufficient documentation

## 2024-02-01 DIAGNOSIS — C61 Malignant neoplasm of prostate: Secondary | ICD-10-CM | POA: Insufficient documentation

## 2024-02-01 DIAGNOSIS — G4733 Obstructive sleep apnea (adult) (pediatric): Secondary | ICD-10-CM | POA: Insufficient documentation

## 2024-02-01 DIAGNOSIS — Z95818 Presence of other cardiac implants and grafts: Secondary | ICD-10-CM | POA: Insufficient documentation

## 2024-02-01 DIAGNOSIS — I129 Hypertensive chronic kidney disease with stage 1 through stage 4 chronic kidney disease, or unspecified chronic kidney disease: Secondary | ICD-10-CM | POA: Diagnosis not present

## 2024-02-01 DIAGNOSIS — N1831 Chronic kidney disease, stage 3a: Secondary | ICD-10-CM | POA: Diagnosis not present

## 2024-02-01 DIAGNOSIS — I4821 Permanent atrial fibrillation: Secondary | ICD-10-CM | POA: Diagnosis not present

## 2024-02-01 DIAGNOSIS — I4891 Unspecified atrial fibrillation: Secondary | ICD-10-CM | POA: Diagnosis present

## 2024-02-01 DIAGNOSIS — R001 Bradycardia, unspecified: Secondary | ICD-10-CM | POA: Diagnosis not present

## 2024-02-01 HISTORY — PX: TEE WITHOUT CARDIOVERSION: SHX5443

## 2024-02-01 LAB — ECHO TEE

## 2024-02-01 SURGERY — ECHOCARDIOGRAM, TRANSESOPHAGEAL
Anesthesia: General

## 2024-02-01 MED ORDER — PROPOFOL 1000 MG/100ML IV EMUL
INTRAVENOUS | Status: AC
  Filled 2024-02-01: qty 100

## 2024-02-01 MED ORDER — GLYCOPYRROLATE 0.2 MG/ML IJ SOLN
INTRAMUSCULAR | Status: DC | PRN
  Administered 2024-02-01: .2 mg via INTRAVENOUS

## 2024-02-01 MED ORDER — GLYCOPYRROLATE 0.2 MG/ML IJ SOLN
INTRAMUSCULAR | Status: AC
  Filled 2024-02-01: qty 1

## 2024-02-01 MED ORDER — ASPIRIN 81 MG PO TBEC: 81.0000 mg | DELAYED_RELEASE_TABLET | Freq: Every day | ORAL | 12 refills | Status: AC

## 2024-02-01 MED ORDER — SODIUM CHLORIDE 0.9 % IV SOLN: INTRAVENOUS | Status: DC

## 2024-02-01 MED ORDER — PROPOFOL 10 MG/ML IV BOLUS
INTRAVENOUS | Status: DC | PRN
  Administered 2024-02-01 (×2): 30 mg via INTRAVENOUS
  Administered 2024-02-01: 20 mg via INTRAVENOUS
  Administered 2024-02-01: 30 mg via INTRAVENOUS
  Administered 2024-02-01: 80 mg via INTRAVENOUS

## 2024-02-01 NOTE — Anesthesia Postprocedure Evaluation (Signed)
"   Anesthesia Post Note  Patient: Francisco Freeman  Procedure(s) Performed: ECHOCARDIOGRAM, TRANSESOPHAGEAL  Patient location during evaluation: Specials Recovery Anesthesia Type: General Level of consciousness: awake and alert Pain management: pain level controlled Vital Signs Assessment: post-procedure vital signs reviewed and stable Respiratory status: spontaneous breathing, nonlabored ventilation, respiratory function stable and patient connected to nasal cannula oxygen Cardiovascular status: blood pressure returned to baseline and stable Postop Assessment: no apparent nausea or vomiting Anesthetic complications: no   No notable events documented.   Last Vitals:  Vitals:   02/01/24 1315 02/01/24 1330  BP: 99/65 (!) 103/55  Pulse: (!) 42 (!) 42  Resp: 16 15  Temp:  36.7 C  SpO2: 98% 98%    Last Pain:  Vitals:   02/01/24 1330  TempSrc: Temporal  PainSc: 0-No pain                 [name]      "

## 2024-02-01 NOTE — Anesthesia Preprocedure Evaluation (Signed)
 "                                  Anesthesia Evaluation  Patient identified by MRN, date of birth, ID band Patient awake    Reviewed: Allergy & Precautions, NPO status , Patient's Chart, lab work & pertinent test results  History of Anesthesia Complications Negative for: history of anesthetic complications  Airway Mallampati: III  TM Distance: >3 FB Neck ROM: full    Dental no notable dental hx.    Pulmonary neg shortness of breath, sleep apnea and Continuous Positive Airway Pressure Ventilation , neg COPD, neg recent URI   Pulmonary exam normal        Cardiovascular hypertension, On Medications (-) angina (-) Past MI and (-) Cardiac Stents + dysrhythmias (s/p ablation and watchman) Atrial Fibrillation (-) Valvular Problems/Murmurs     Neuro/Psych negative neurological ROS  negative psych ROS   GI/Hepatic Neg liver ROS,GERD  ,,  Endo/Other    Class 3 obesity  Renal/GU Renal disease (CKD)     Musculoskeletal   Abdominal   Peds  Hematology negative hematology ROS (+)   Anesthesia Other Findings Past Medical History: No date: Arthritis     Comment:  Gout No date: B12 deficiency No date: Chronic kidney disease No date: Edema No date: Gout No date: History of kidney stones No date: Hypertension No date: Prostate cancer (HCC) No date: Recurrent prostate cancer (HCC) No date: Sleep apnea No date: Thrombocytopenia (HCC)  Past Surgical History: 11/12/2021: BALLOON DILATION; N/A     Comment:  Procedure: OPTILUME BALLOON DILATION;  Surgeon: Francisca Redell BROCKS, MD;  Location: [facility] ORS;  Service: Urology;                Laterality: N/A; No date: COLONOSCOPY 12/28/2016: COLONOSCOPY; N/A     Comment:  Procedure: COLONOSCOPY;  Surgeon: Viktoria Lamar DASEN, MD;              Location: [facility] ENDOSCOPY;  Service: Endoscopy;                Laterality: N/A; 07/15/2022: COLONOSCOPY WITH PROPOFOL ; N/A     Comment:  Procedure: COLONOSCOPY WITH  PROPOFOL ;  Surgeon: Onita Elspeth Sharper, DO;  Location: [facility] ENDOSCOPY;  Service:               Gastroenterology;  Laterality: N/A; 11/12/2021: CYSTOSCOPY WITH RETROGRADE URETHROGRAM; N/A     Comment:  Procedure: CYSTOSCOPY WITH RETROGRADE URETHROGRAM;                Surgeon: Francisca Redell BROCKS, MD;  Location: [facility] ORS;                Service: Urology;  Laterality: N/A; 07/15/2022: POLYPECTOMY     Comment:  Procedure: POLYPECTOMY;  Surgeon: Onita Elspeth Sharper,              DO;  Location: [facility] ENDOSCOPY;  Service:               Gastroenterology;; No date: PROSTATE SURGERY     Comment:  robot assisted prostatectomy 01/24/2006: PROSTATECTOMY No date: TONSILLECTOMY     Reproductive/Obstetrics negative OB ROS  Anesthesia Physical Anesthesia Plan  ASA: 3  Anesthesia Plan: General   Post-op Pain Management: Minimal or no pain anticipated   Induction: Intravenous  PONV Risk Score and Plan: 3 and Propofol  infusion, TIVA and Ondansetron   Airway Management Planned: Nasal Cannula and Natural Airway  Additional Equipment: None  Intra-op Plan:   Post-operative Plan:   Informed Consent: I have reviewed the patients History and Physical, chart, labs and discussed the procedure including the risks, benefits and alternatives for the proposed anesthesia with the patient or authorized representative who has indicated his/her understanding and acceptance.     Dental advisory given  Plan Discussed with: CRNA and Surgeon  Anesthesia Plan Comments: (Discussed risks of anesthesia with patient, including possibility of difficulty with spontaneous ventilation under anesthesia necessitating airway intervention, PONV, and rare risks such as cardiac or respiratory or neurological events, and allergic reactions. Discussed the role of CRNA in patient's perioperative care. Patient understands.)        Anesthesia Quick  Evaluation  "

## 2024-02-01 NOTE — Progress Notes (Signed)
*  PRELIMINARY RESULTS* Echocardiogram Echocardiogram Transesophageal has been performed.  Floydene Harder 02/01/2024, 1:00 PM

## 2024-02-01 NOTE — Transfer of Care (Signed)
 Immediate Anesthesia Transfer of Care Note  Patient: Francisco Freeman  Procedure(s) Performed: ECHOCARDIOGRAM, TRANSESOPHAGEAL  Patient Location: PACU and Nursing Unit  Anesthesia Type:General  Level of Consciousness: awake, alert , and oriented  Airway & Oxygen Therapy: Patient Spontanous Breathing and Patient connected to nasal cannula oxygen  Post-op Assessment: Report given to RN and Post -op Vital signs reviewed and stable  Post vital signs: Reviewed and stable  Last Vitals:  Vitals Value Taken Time  BP 101/69  02/01/24 12:48  Temp    Pulse 45 02/01/24 12:48  Resp 19 02/01/24 12:48  SpO2 96 % 02/01/24 12:48    Last Pain:  Vitals:   02/01/24 1117  TempSrc: Temporal  PainSc: 0-No pain         Complications: No notable events documented.

## 2024-02-02 ENCOUNTER — Encounter: Payer: Self-pay | Admitting: Cardiology
